# Patient Record
Sex: Female | Born: 1960
Health system: Southern US, Community
[De-identification: ages and names within clinical notes are randomized; demographics above are authoritative.]

## PROBLEM LIST (undated history)

## (undated) DIAGNOSIS — G4700 Insomnia, unspecified: Secondary | ICD-10-CM

## (undated) DIAGNOSIS — K219 Gastro-esophageal reflux disease without esophagitis: Secondary | ICD-10-CM

## (undated) DIAGNOSIS — R251 Tremor, unspecified: Secondary | ICD-10-CM

## (undated) DIAGNOSIS — T7840XA Allergy, unspecified, initial encounter: Secondary | ICD-10-CM

## (undated) DIAGNOSIS — M797 Fibromyalgia: Secondary | ICD-10-CM

## (undated) DIAGNOSIS — F32A Depression, unspecified: Secondary | ICD-10-CM

## (undated) DIAGNOSIS — M24549 Contracture, unspecified hand: Secondary | ICD-10-CM

## (undated) DIAGNOSIS — M1990 Unspecified osteoarthritis, unspecified site: Secondary | ICD-10-CM

## (undated) HISTORY — DX: Contracture, unspecified hand: M24.549

## (undated) HISTORY — PX: TONSILLECTOMY: SUR1361

## (undated) HISTORY — DX: Insomnia, unspecified: G47.00

## (undated) HISTORY — PX: TUBAL LIGATION: SHX77

## (undated) HISTORY — PX: REPAIR OF SYNDACTYLY HAND: SUR1196

## (undated) HISTORY — DX: Depression, unspecified: F32.A

## (undated) HISTORY — DX: Unspecified osteoarthritis, unspecified site: M19.90

## (undated) HISTORY — PX: DILATION AND CURETTAGE OF UTERUS: SHX78

## (undated) HISTORY — DX: Allergy, unspecified, initial encounter: T78.40XA

## (undated) HISTORY — DX: Gastro-esophageal reflux disease without esophagitis: K21.9

## (undated) HISTORY — DX: Fibromyalgia: M79.7

## (undated) HISTORY — PX: SPINE SURGERY: SHX786

## (undated) HISTORY — DX: Tremor, unspecified: R25.1

---

## 2001-10-11 ENCOUNTER — Encounter: Payer: Self-pay | Admitting: Obstetrics and Gynecology

## 2001-10-11 ENCOUNTER — Ambulatory Visit (HOSPITAL_COMMUNITY): Admission: RE | Admit: 2001-10-11 | Discharge: 2001-10-11 | Payer: Self-pay | Admitting: Obstetrics and Gynecology

## 2002-07-20 ENCOUNTER — Encounter (HOSPITAL_COMMUNITY): Admission: RE | Admit: 2002-07-20 | Discharge: 2002-08-19 | Payer: Self-pay | Admitting: Preventative Medicine

## 2006-04-10 ENCOUNTER — Ambulatory Visit (HOSPITAL_COMMUNITY): Admission: RE | Admit: 2006-04-10 | Discharge: 2006-04-10 | Payer: Self-pay | Admitting: Orthopedic Surgery

## 2007-04-29 ENCOUNTER — Ambulatory Visit (HOSPITAL_COMMUNITY): Admission: RE | Admit: 2007-04-29 | Discharge: 2007-04-29 | Payer: Self-pay | Admitting: Orthopaedic Surgery

## 2008-08-03 ENCOUNTER — Encounter: Admission: RE | Admit: 2008-08-03 | Discharge: 2008-08-03 | Payer: Self-pay | Admitting: Obstetrics and Gynecology

## 2008-09-24 ENCOUNTER — Emergency Department (HOSPITAL_COMMUNITY): Admission: EM | Admit: 2008-09-24 | Discharge: 2008-09-24 | Payer: Self-pay | Admitting: Emergency Medicine

## 2010-06-19 ENCOUNTER — Telehealth (INDEPENDENT_AMBULATORY_CARE_PROVIDER_SITE_OTHER): Payer: Self-pay | Admitting: Radiology

## 2010-06-23 ENCOUNTER — Encounter: Payer: Self-pay | Admitting: Internal Medicine

## 2010-06-23 ENCOUNTER — Ambulatory Visit (HOSPITAL_COMMUNITY)
Admission: RE | Admit: 2010-06-23 | Discharge: 2010-06-23 | Payer: Self-pay | Source: Home / Self Care | Attending: Family Medicine | Admitting: Family Medicine

## 2010-06-23 ENCOUNTER — Ambulatory Visit: Admit: 2010-06-23 | Payer: Self-pay

## 2010-06-23 ENCOUNTER — Ambulatory Visit: Admission: RE | Admit: 2010-06-23 | Discharge: 2010-06-23 | Payer: Self-pay | Source: Home / Self Care

## 2010-07-10 NOTE — Progress Notes (Signed)
Summary: stress echo pre-procedure  Phone Note Outgoing Call   Call placed by: Domenic Polite, CNMT,  June 19, 2010 12:16 PM Call placed to: Patient Reason for Call: Confirm/change Appt Summary of Call: [facility] about Stress Echocardiogram.

## 2010-07-23 ENCOUNTER — Ambulatory Visit: Payer: Self-pay | Admitting: Cardiology

## 2010-07-30 ENCOUNTER — Ambulatory Visit (HOSPITAL_COMMUNITY)
Admission: RE | Admit: 2010-07-30 | Discharge: 2010-07-30 | Disposition: A | Discharge status: Home / Self Care | Payer: 59 | Source: Ambulatory Visit | Attending: Cardiology | Admitting: Cardiology

## 2010-07-30 ENCOUNTER — Encounter: Payer: Self-pay | Admitting: Cardiology

## 2010-07-30 ENCOUNTER — Other Ambulatory Visit: Payer: Self-pay | Admitting: Cardiology

## 2010-07-30 ENCOUNTER — Ambulatory Visit (INDEPENDENT_AMBULATORY_CARE_PROVIDER_SITE_OTHER): Payer: Commercial Managed Care - PPO | Admitting: Cardiology

## 2010-07-30 DIAGNOSIS — R079 Chest pain, unspecified: Secondary | ICD-10-CM

## 2010-07-30 DIAGNOSIS — R0789 Other chest pain: Secondary | ICD-10-CM | POA: Insufficient documentation

## 2010-07-30 DIAGNOSIS — R9430 Abnormal result of cardiovascular function study, unspecified: Secondary | ICD-10-CM

## 2010-07-31 ENCOUNTER — Encounter: Payer: Self-pay | Admitting: Cardiology

## 2010-08-05 ENCOUNTER — Inpatient Hospital Stay (HOSPITAL_BASED_OUTPATIENT_CLINIC_OR_DEPARTMENT_OTHER)
Admission: RE | Admit: 2010-08-05 | Discharge: 2010-08-05 | Disposition: A | Discharge status: Home / Self Care | Payer: 59 | Source: Ambulatory Visit | Attending: Internal Medicine | Admitting: Internal Medicine

## 2010-08-05 ENCOUNTER — Ambulatory Visit: Payer: Self-pay | Admitting: Cardiology

## 2010-08-05 DIAGNOSIS — R9439 Abnormal result of other cardiovascular function study: Secondary | ICD-10-CM | POA: Insufficient documentation

## 2010-08-05 DIAGNOSIS — R079 Chest pain, unspecified: Secondary | ICD-10-CM

## 2010-08-05 DIAGNOSIS — Q245 Malformation of coronary vessels: Secondary | ICD-10-CM | POA: Insufficient documentation

## 2010-08-05 NOTE — Assessment & Plan Note (Signed)
Summary: ***np 2d echo septal hypokinesis w exercise suggestive of isc...   Visit Type:  Initial Consult Primary Provider:  Dr. Fara Chute   History of Present Illness: 50 year old woman referred for cardiology consultation. She reports a long-standing history of intermittent chest pain, at least over the last 6 years, not clearly exertional. She indicates that this has been most likely consistent with her fibromyalgia. Approximately 3 months ago she experienced a more intense episode of back pain/mid scapular discomfort, that began when she was at work on third shift. Her typical chest pains are a "deep" left breast discomfort. This pain was more intense and sharp. She eventually mentioned this to Dr. Neita Carp at an office visit and he arranged for her to have a followup exercise echocardiogram, performed back in mid-January in our [place] office. The study was read by Dr. Jens Som, and the full report is outlined below.  Labs from December 2011 showed glucose 86, BUN 12, creatinine 0.7, sodium 139, potassium 3.8, AST 13, ALT 11, WBC 6.9, hemoglobin 13.6, platelets 28, cholesterol 182, triglycerides 89, HDL 79, LDL 86, TSH 3.0. Faxed copy of ECG from December 2011 showed sinus rhythm with possible left atrial enlargement, decreased R-wave progression without frank anterior Q waves.  Today we discussed the results of her stress test. She indicated that she was actually not aware of the results at all. It is interesting to note that the ECG portion of the study was normal, however there was description of a hypotensive response to exercise, and septal hypokinesis with stress. One wonders if the blood pressure response was artifactual, and perhaps the septal abnormality may have been related to off axis imaging, as this would be an unusual distribution for a significant blockage in a major epicardial vessel. Having said that, the study is technically abnormal, and she continues to have chest pain  symptoms.  Current Medications (verified): 1)  Ultram 50 Mg Tabs (Tramadol Hcl) .... Take As Needed 2)  Ambien 10 Mg Tabs (Zolpidem Tartrate) .... Take At Bedtime As Needed  Allergies (verified): 1)  ! Codeine  Comments:  Nurse/Medical Assistant: patient and i reviewed meds she is only on 2 meds [facility] in [place]  Past History:  Family History: Last updated: 07/30/2010 Father: hypertension, heart block in his 34s, status post pacemaker Mother: hypertension, status post pacemaker, hyperlipidemia Siblings: brother with hypertension, sister with "heart valve problem"  Social History: Last updated: 07/30/2010 Tobacco Use - No.  Alcohol Use - yes (social) Regular Exercise - no Drug Use - no Full Time - third shift nurse at [employer]  Past Medical History: Insomnia Fibromyalgia Congenital syndactyly status post surgical repair  Past Surgical History: Tonsillectomy Tubal ligation D&C  Family History: Father: hypertension, heart block in his 23s, status post pacemaker Mother: hypertension, status post pacemaker, hyperlipidemia Siblings: brother with hypertension, sister with "heart valve problem"  Social History: Tobacco Use - No.  Alcohol Use - yes (social) Regular Exercise - no Drug Use - no Full Time - third shift nurse at [employer]  Review of Systems       The patient complains of chest pain.  The patient denies anorexia, fever, weight loss, syncope, dyspnea on exertion, peripheral edema, prolonged cough, hemoptysis, abdominal pain, melena, hematochezia, and severe indigestion/heartburn.         Otherwise reviewed and negative.  Vital Signs:  Patient profile:   50 year old female Height:      70 inches Weight:      169 pounds  BMI:     24.34 Pulse rate:   67 / minute BP sitting:   115 / 68  (left arm)  Vitals Entered By: Dreama Saa, CNA (July 30, 2010 2:44 PM)  Physical Exam  Additional Exam:  Normally nourished appearing  woman in no acute distress. HEENT: Conjunctiva and lids normal, oropharynx clear. Neck: Supple, no elevated JVP or carotid bruits, no thyromegaly. Lungs: Clear to auscultation, nonlabored. Cardiac: Regular rate and rhythm, no S3 or rub. Abdomen: Soft, nontender, bowel sounds present. Skin: Warm and dry. Extremities: No pitting edema, distal pulses full. Musculoskeletal: No kyphosis. Neuropsychiatric: Alert and oriented x3, affect appropriate.   Stress Echocardiogram  Procedure date:  06/23/2010  Findings:      Stress results: Maximal heart rate during stress was 168bpm (98% of   maximal predicted heart rate). The maximal predicted heart rate was   172bpm. The target heart rate was achieved. The heart rate response   to stress was normal. There was a normal resting blood pressure with   a hypotensive response to stress. The rate-pressure product for the   peak heart rate and blood pressure was Hg/min. Stress   induced chest pain.    --------------------------------------------------------------------   Stress ECG: There were no stress arrhythmias or conduction   abnormalities. The stress ECG was normal.    --------------------------------------------------------------------   Baseline:    - LV size was normal.   - LV global systolic function was normal. The estimated LV ejection     fraction was 60%.   - Normal wall motion; no LV regional wall motion abnormalities.   Peak stress:    - LV size was reduced appropriately.   - LV global systolic function was normal. The estimated LV ejection     fraction was 65%.   - Hypokinesis of the septum.    --------------------------------------------------------------------   Stress echo results: Left ventricular ejection fraction was normal   at rest and with stress. Septal hypokinesis noted with stress.  EKG  Procedure date:  07/30/2010  Findings:      Normal sinus rhythm with sinus arrhythmia, 72 beats per minute,  possible left atrial enlargement.  Impression & Recommendations:  Problem # 1:  CARDIOVASCULAR STUDIES, ABNORMAL (ICD-794.30)  Abnormal exercise echocardiogram as outlined above. Admittedly, it is unusual for there to be no diagnostic ST segment changes with a true hypotensive response to exercise, and in the absence of chest pain. A focal septal wall motion abnormality would also be unusual for ischemia in the distribution of a major epicardial vessel. Having said that, she continues to have intermittent chest pain symptoms, and a clear diagnosis is needed. We discussed further diagnostic options, and plan will be to proceed with a diagnostic cardiac catheterization as an outpatient, via radial approach, to clearly define the coronary anatomy, exclude any potential coronary anomalies, and also assess for obstructive coronary artery disease. Risks and benefits were discussed, and she is in agreement proceed.  Problem # 2:  CHEST PAIN (ICD-786.50)  Largely atypical features. Further testing is pending as discussed above.  Orders: T-Basic Metabolic Panel [id number]) Cardiac Catheterization (Cardiac Cath) T-Chest x-ray, 2 views ([id number])  Other Orders: T-CBC w/Diff ([id number]) T-PTT ([id number]) T-Protime, Auto [id number])  Patient Instructions: 1)  Your physician recommends that you schedule a follow-up appointment in: after Cath 2)  Your physician recommends that you return for lab work in: TODAY 3)  Your physician recommends that you continue on your current medications as directed. Please refer to the Current  Medication list given to you today. 4)  A chest x-ray takes a picture of the organs and structures inside the chest, including the heart, lungs, and blood vessels. This test can show several things, including, whether the heart is enlarged; whether fluid is building up in the lungs; and whether pacemaker / defibrillator leads are still in place. 5)  Your physician has requested  that you have a cardiac catheterization.  Cardiac catheterization is used to diagnose and/or treat various heart conditions. Doctors may recommend this procedure for a number of different reasons. The most common reason is to evaluate chest pain. Chest pain can be a symptom of coronary artery disease (CAD), and cardiac catheterization can show whether plaque is narrowing or blocking your heart's arteries. This procedure is also used to evaluate the valves, as well as measure the blood flow and oxygen levels in different parts of your heart.  For further information please visit [email or web address]  Please follow instruction sheet, as given.

## 2010-08-05 NOTE — Letter (Signed)
Summary: Cardiac Catheterization Instructions- J[facility] Lab  [facility] [place]  [address], [place] [address]   Phone: [phone]  Fax: [phone]     07/30/2010 MRN: [id number]  Miranda Petersen [address] [place], [place]  [address]  [place]  Dear Ms. Tavenner,   You are scheduled for a Cardiac Catheterization on _2-28-12________ with Dr.__Cooper___________  Please arrive to the 1st floor of the Heart and Vascular Center at [facility] at _10:30____ am on the day of your procedure. Please do not arrive before 6:30 a.m. Call the Heart and Vascular Center at [phone] if you are unable to make your appointmnet. The Code to get into the parking garage under the building is__0300______. Take the elevators to the 1st floor. You must have someone to drive you home. Someone must be with you for the first 24 hours after you arrive home. Please wear clothes that are easy to get on and off and wear slip-on shoes. Do not eat or drink after midnight except water with your medications that morning. Bring all your medications and current insurance cards with you.  ___ DO NOT take these medications before your procedure: ________________________________________________________________  ___ Make sure you take your aspirin.  _X__ You may take ALL of your medications with water that morning. ________________________________________________________________________________________________________________________________  ___ DO NOT take ANY medications before your procedure.  ___ Pre-med instructions:  ________________________________________________________________________________________________________________________________  The usual length of stay after your procedure is 2 to 3 hours. This can vary.  If you have any questions, please call the office at the number listed above.   Larita Fife Via LPN

## 2010-08-05 NOTE — Letter (Signed)
Summary: [facility] RECORDS  [facility] RECORDS   Imported By: Faythe Ghee 07/31/2010 11:05:38  _____________________________________________________________________  External Attachment:    Type:   Image     Comment:   External Document

## 2010-08-11 ENCOUNTER — Encounter: Payer: Self-pay | Admitting: Cardiology

## 2010-08-18 ENCOUNTER — Telehealth (INDEPENDENT_AMBULATORY_CARE_PROVIDER_SITE_OTHER): Payer: Self-pay

## 2010-08-19 ENCOUNTER — Encounter: Payer: Self-pay | Admitting: Cardiology

## 2010-08-19 ENCOUNTER — Ambulatory Visit (HOSPITAL_COMMUNITY): Payer: 59 | Attending: Internal Medicine

## 2010-08-19 DIAGNOSIS — R079 Chest pain, unspecified: Secondary | ICD-10-CM | POA: Insufficient documentation

## 2010-08-19 DIAGNOSIS — I2510 Atherosclerotic heart disease of native coronary artery without angina pectoris: Secondary | ICD-10-CM

## 2010-08-19 DIAGNOSIS — R9430 Abnormal result of cardiovascular function study, unspecified: Secondary | ICD-10-CM

## 2010-08-25 ENCOUNTER — Other Ambulatory Visit: Payer: Self-pay | Admitting: Anesthesiology

## 2010-08-25 ENCOUNTER — Other Ambulatory Visit: Payer: Self-pay | Admitting: Infectious Diseases

## 2010-08-25 ENCOUNTER — Encounter: Payer: Self-pay | Admitting: *Deleted

## 2010-08-25 ENCOUNTER — Ambulatory Visit (HOSPITAL_COMMUNITY)
Admission: RE | Admit: 2010-08-25 | Discharge: 2010-08-25 | Disposition: A | Discharge status: Home / Self Care | Payer: 59 | Source: Ambulatory Visit | Attending: Podiatry | Admitting: Podiatry

## 2010-08-25 ENCOUNTER — Encounter (HOSPITAL_COMMUNITY): Payer: 59

## 2010-08-25 ENCOUNTER — Other Ambulatory Visit (HOSPITAL_COMMUNITY): Payer: Self-pay | Admitting: Podiatry

## 2010-08-25 DIAGNOSIS — M2030 Hallux varus (acquired), unspecified foot: Secondary | ICD-10-CM

## 2010-08-25 DIAGNOSIS — M25579 Pain in unspecified ankle and joints of unspecified foot: Secondary | ICD-10-CM | POA: Insufficient documentation

## 2010-08-25 DIAGNOSIS — M25476 Effusion, unspecified foot: Secondary | ICD-10-CM | POA: Insufficient documentation

## 2010-08-25 DIAGNOSIS — M25473 Effusion, unspecified ankle: Secondary | ICD-10-CM | POA: Insufficient documentation

## 2010-08-25 DIAGNOSIS — L84 Corns and callosities: Secondary | ICD-10-CM

## 2010-08-25 LAB — HEMOGLOBIN AND HEMATOCRIT, BLOOD: Hemoglobin: 13.3 g/dL (ref 12.0–15.0)

## 2010-08-25 LAB — BASIC METABOLIC PANEL
BUN: 13 mg/dL (ref 6–23)
Creatinine, Ser: 0.76 mg/dL (ref 0.4–1.2)
GFR calc non Af Amer: >60 mL/min (ref >60)
Glucose, Bld: 81 mg/dL (ref 70–99)
Potassium: 4.4 mEq/L (ref 3.5–5.1)

## 2010-08-26 NOTE — Progress Notes (Signed)
Summary: Nuc. Pre-Procedure  Phone Note Outgoing Call Call back at [facility] Phone [phone]   Summary of Call: Left message with information on Myoview Information Sheet (see scanned document for details) by Melvyn Novas.Jaquesha Boroff,RN.      Nuclear Med Background Indications for Stress Test: Evaluation for Ischemia  Indications Comments: Recent mild (+) Stress Echo>2/28/12Cath: Prominent LAD myocardial bridging> assess LAD for true ischemia.  History: Echo, Heart Catheterization  History Comments: 06/23/10 Stress Echo: Septal wall motion abnormality,EF=60-65%. 08/05/10 Cath:EF=60-65%, Prominent LAD myocardial bridging.  Symptoms: Chest Pain  Symptoms Comments: CP radiates to back and mid scapular pain.   Nuclear Pre-Procedure Height (in): 70

## 2010-08-26 NOTE — Assessment & Plan Note (Addendum)
Summary: Cardiology Nuclear Testing  Nuclear Med Background Indications for Stress Test: Evaluation for Ischemia  Indications Comments: Assess LAD for true ischemia.  History: Echo, Heart Catheterization  History Comments: 06/23/10 Stress Echo:Septal wall motion abnormality, EF=60-65%>08/05/10 Cath:EF=60-65%, prominent LAD myocardial bridging.  Symptoms: Chest Pain, Fatigue, Rapid HR, SOB  Symptoms Comments: CP>back. Last episode of CP:8:30 a.m. today, 2/10.   Nuclear Pre-Procedure Caffeine/Decaff Intake: None NPO After: 8:00 PM Lungs: Clear IV 0.9% NS with Angio Cath: 22g     IV Site: R Antecubital IV Started by: Irean Hong, RN Chest Size (in) 36     Cup Size D     Height (in): 70 Weight (lb): 163.5 BMI: 23.54  Nuclear Med Study 1 or 2 day study:  1 day     Stress Test Type:  Stress Reading MD:  Willa Rough, MD     Referring MD:  Arvilla Meres, MD Resting Radionuclide:  Technetium 84m Tetrofosmin     Resting Radionuclide Dose:  11 mCi  Stress Radionuclide:  Technetium 1m Tetrofosmin     Stress Radionuclide Dose:  33 mCi   Stress Protocol Exercise Time (min):  9:00 min     Max HR:  166 bpm     Predicted Max HR:  172 bpm  Max Systolic BP: 167 mm Hg     Percent Max HR:  96.51 %     METS: 10.4 Rate Pressure Product:  [address]    Stress Test Technologist:  Rea College, CMA-N     Nuclear Technologist:  Domenic Polite, CNMT  Rest Procedure  Myocardial perfusion imaging was performed at rest 45 minutes following the intravenous administration of Technetium 43m Tetrofosmin.  Stress Procedure  The patient exercised for nine minutes on the treadmill utilizing the Bruce protocol.  The patient stopped due to fatigue.  She did c/o chest tightness, 7/10, with exercise.  There were no significant ST-T wave changes.  Technetium 23m Tetrofosmin was injected at peak exercise and myocardial perfusion imaging was performed after a brief delay.  QPS Raw Data Images:  Normal; no motion  artifact; normal heart/lung ratio. Stress Images:  Normal homogeneous uptake in all areas of the myocardium. Rest Images:  Normal homogeneous uptake in all areas of the myocardium. Subtraction (SDS):  No evidence of ischemia. Transient Ischemic Dilatation:  1.04  (Normal <1.22)  Lung/Heart Ratio:  .33  (Normal <0.45)  Quantitative Gated Spect Images QGS EDV:  59 ml QGS ESV:  14 ml QGS EF:  76 % QGS cine images:  Normal  Findings Normal nuclear study      Overall Impression  Exercise Capacity: Fairly good BP Response: Normal blood pressure response. Clinical Symptoms: Chest tight  7/10 ECG Impression: No significant ST segment change suggestive of ischemia. Overall Impression: Normal stress nuclear study.  Appended Document: Cardiology Nuclear Testing looks good. Myocardial bridge does not appear hemodynamicaly significant.

## 2010-08-28 ENCOUNTER — Ambulatory Visit (HOSPITAL_COMMUNITY)
Admission: RE | Admit: 2010-08-28 | Discharge: 2010-08-28 | Disposition: A | Discharge status: Home / Self Care | Payer: 59 | Source: Ambulatory Visit | Attending: Podiatry | Admitting: Podiatry

## 2010-08-28 ENCOUNTER — Ambulatory Visit (HOSPITAL_COMMUNITY): Payer: 59

## 2010-08-28 DIAGNOSIS — L84 Corns and callosities: Secondary | ICD-10-CM | POA: Insufficient documentation

## 2010-08-28 DIAGNOSIS — M2030 Hallux varus (acquired), unspecified foot: Secondary | ICD-10-CM | POA: Insufficient documentation

## 2010-09-01 ENCOUNTER — Ambulatory Visit: Payer: Commercial Managed Care - PPO | Admitting: Cardiology

## 2010-09-01 ENCOUNTER — Ambulatory Visit: Payer: Commercial Managed Care - PPO | Admitting: Adult Health

## 2010-09-04 ENCOUNTER — Encounter: Payer: Commercial Managed Care - PPO | Admitting: Internal Medicine

## 2010-09-04 NOTE — Letter (Signed)
Summary: [place] Results Engineer, agricultural at [facility]  [address] [place] [address]   Phone: [phone]  Fax: [phone]      August 25, 2010 MRN: [id number]   [facility] [address] [place], [place]  [address]   Dear Ms. Fiske,  Your test ordered by Selena Batten has been reviewed by your physician (or physician assistant) and was found to be  stable. Your physician (or physician assistant) felt no changes were needed at this time.  ____ Echocardiogram  __x__ Cardiac Stress Test  ____ Lab Work  ____ Peripheral vascular study of arms, legs or neck  ____ CT scan or X-ray  ____ Lung or Breathing test  ____ Other:  No change in medical treatment at this time, per Dr. Diona Browner. Thank you, Melba Araki Allyne Gee RN    Fords Bing, MD, Lenise Arena.C.Gaylord Shih, MD, F.A.C.C Lewayne Bunting, MD, F.A.C.C Nona Dell, MD, F.A.C.C Charlton Haws, MD, Lenise Arena.C.C

## 2010-09-04 NOTE — Procedures (Signed)
  NAME:  Delval, Miranda Petersen                 ACCOUNT NO.:  [id number]  MEDICAL RECORD NO.:  [id number]           PATIENT TYPE:  O  LOCATION:  RAD                           FACILITY:  [facility]  PHYSICIAN:  Bevelyn Buckles. Brandon Wiechman, MDDATE OF BIRTH:  Sep 23, 1960  DATE OF PROCEDURE:  08/05/2010 DATE OF DISCHARGE:  07/30/2010                           CARDIAC CATHETERIZATION   PRIMARY CARE PHYSICIAN:  Dr. Fara Chute.  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  She had a stress echocardiogram which had a normal EKG, but had a question of hypotensive response to exercise with septal wall motion abnormality with exercise. Results of the test were reviewed with her and she was referred for cardiac catheterization.  PROCEDURES PERFORMED: 1. Selective coronary angiography. 2. Left heart cath. 3. Left ventriculogram.  DESCRIPTION OF PROCEDURE:  The risks and indication were explained. Consent was signed and placed in the chart.  A 4-French arterial sheath was placed in the right femoral artery using a modified Seldinger technique.  Standard catheters including a JL-4, 3-D RCA and angled pigtail were used.  There were no apparent complications.  Central aortic pressure 118/75 with a mean of 95.  LV pressure 135/1 with an EDP of 14.  There was no aortic stenosis.  There was question of very mild mid cavitary gradient.  CORONARY ANATOMY:  Left main was angiographically normal.  LAD was a long vessel coursing to the apex.  It gave off a large diagonal in the midsection.  Just after the diagonal and the mid LAD, there was evidence of a fairly prominent myocardial bridging segment. There was some haziness during systole, but diastolic flow was normal.  Left circumflex gave off a ramus branch and 2 OMs.  It was angiographically normal.  Right coronary artery was a large dominant vessel gave  off a PDA and a posterolateral.  It was angiographically normal.  Left ventriculogram done in the RAO position and EF of 60-65% with normal wall motion.  ASSESSMENT: 1. No evidence of coronary atherosclerosis. 2. Prominent left anterior descending (coronary artery) myocardial     bridging segment. 3. Normal left ventricular function.  PLAN/DISCUSSION:  By catheterization, her myocardial bridging segment does not appear to be flow-limiting and diastole.  However, she does have symptoms of chest pain and a mildly positive stress echo.  At this point, I think we need to further evaluate with a stress Myoview to see if she is truly ischemic in the LAD territory.  We will arrange this through our office here in [place].  She will follow up with Dr. Diona Browner.     Bevelyn Buckles. Adison Jerger, MD     DRB/MEDQ  D:  08/05/2010  T:  08/06/2010  Job:  [id number]  cc:   Suanne Marker, MD  Electronically Signed by Arvilla Meres MD on 09/04/2010 06:42:51 PM

## 2010-10-08 NOTE — Op Note (Signed)
  NAME:  Schweikert, Quetzaly                 ACCOUNT NO.:  [id number]  MEDICAL RECORD NO.:  [id number]           PATIENT TYPE:  O  LOCATION:  DAYP                          FACILITY:  [facility]  PHYSICIAN:  B. Theola Sequin, MD   DATE OF BIRTH:  [birth year]  DATE OF PROCEDURE:  08/28/2010 DATE OF DISCHARGE:                              OPERATIVE REPORT   SURGEON:  B. Theola Sequin, MD  ASSISTANT:  Neva Seat  PREOPERATIVE DIAGNOSES: 1. Hallux malleus, right foot. 2. Heloma molle, fourth interspace, right foot.  POSTOPERATIVE DIAGNOSES: 1. Hallux malleus, right foot. 2. Heloma molle, fourth interspace, right foot.  PROCEDURES: 1. Arthrodesis of the interphalangeal joint of the right hallux. 2. Syndactylization of the fourth and fifth digits of the right foot.  ANESTHESIA:  MAC with local.  HEMOSTASIS:  Pneumatic ankle tourniquet at 250 mmHg.  ESTIMATED BLOOD LOSS:  Minimal (less than 5 mL).  INJECTABLES:  0.5% Marcaine plain.  PATHOLOGY:  None.  COMPLICATIONS:  None.  PROCEDURE IN DETAIL:  The patient was brought to the operating room and placed on the operative table in supine position.  Pneumatic ankle tourniquet was placed about the patient's right ankle.  The foot was anesthetized using 0.5% Marcaine plain.  The foot was then scrubbed, prepped, and draped in the usual sterile manner.  Limb was then elevated, exsanguinated, and the pneumatic ankle tourniquet inflated to 250 mmHg.  Attention directed to the dorsal aspect of the right hallux where two converging similar optimal incisions were made overlying the interphalangeal joint.  Dissection was continued deep down to the level of the interphalangeal joint.  Transverse tenotomy and capsulotomy was performed.  The articular surface from the head of proximal phalanx and base of the distal phalanx were resected and passed from the operative field.  Following standard principles and techniques, a 2.0 mm Allofix cortical bone  pin and a 3.0 Synthes cannulated screw was inserted across the fusion site with adequate compression noted.  Position was confirmed with C-arm fluoroscopy.  The wound was irrigated with copious amounts of sterile irrigant.  Extensor tendon was reapproximated using 4-0 Vicryl. Skin was reapproximated using 5-0 Monocryl and 5-0 nylon.  Attention was then directed to the fourth interspace of the right foot where a hyperkeratotic lesion was noted along the lateral aspect of the fourth digit.  Skin from the fourth web space of the right foot was resected. Lateral prominence of the proximal phalanx of the fourth digit of the right foot was resected using a power bone bur and the bone was irrigated with copious amounts of sterile irrigant.  Skin was reapproximated using 5-0 nylon in a simple suture technique.  Sterile compressive dressing was then applied to the right foot.  The pneumatic ankle tourniquet deflated and prompt hyperemic response was noted to all digits of the right foot.          ______________________________ B. Theola Sequin, MD     BIM/MEDQ  D:  08/28/2010  T:  08/28/2010  Job:  [id number]  Electronically Signed by Rexford Maus  on 10/08/2010 03:13:00 PM

## 2011-03-27 ENCOUNTER — Other Ambulatory Visit: Payer: Self-pay | Admitting: Obstetrics and Gynecology

## 2011-03-27 DIAGNOSIS — Z1231 Encounter for screening mammogram for malignant neoplasm of breast: Secondary | ICD-10-CM

## 2011-04-13 ENCOUNTER — Ambulatory Visit: Payer: 59

## 2011-04-14 ENCOUNTER — Ambulatory Visit
Admission: RE | Admit: 2011-04-14 | Discharge: 2011-04-14 | Disposition: A | Discharge status: Home / Self Care | Payer: 59 | Source: Ambulatory Visit | Attending: Obstetrics and Gynecology | Admitting: Obstetrics and Gynecology

## 2011-04-14 DIAGNOSIS — Z1231 Encounter for screening mammogram for malignant neoplasm of breast: Secondary | ICD-10-CM

## 2011-05-15 ENCOUNTER — Ambulatory Visit (INDEPENDENT_AMBULATORY_CARE_PROVIDER_SITE_OTHER): Payer: 59 | Admitting: Cardiology

## 2011-05-15 ENCOUNTER — Encounter: Payer: Self-pay | Admitting: Cardiology

## 2011-05-15 VITALS — BP 120/66 | HR 76 | Ht 70.0 in | Wt 176.0 lb

## 2011-05-15 DIAGNOSIS — R9430 Abnormal result of cardiovascular function study, unspecified: Secondary | ICD-10-CM

## 2011-05-15 NOTE — Patient Instructions (Signed)
Your physician recommends that you schedule a follow-up appointment in: 1 year  

## 2011-05-15 NOTE — Assessment & Plan Note (Signed)
Specifically, history of myocardial bridging of LAD without definite evidence of ischemia by followup Myoview, otherwise normal coronary arteries. For now recommend observation. If symptoms progress or she has more palpitations, could consider a cardiac monitor. Office followup arranged.

## 2011-05-15 NOTE — Progress Notes (Signed)
**Note De-Identified Rosela Supak Obfuscation** Addended by: Demetrios Loll on: 05/15/2011 04:29 PM   Modules accepted: Orders

## 2011-05-15 NOTE — Progress Notes (Signed)
Clinical Summary Miranda Petersen is a 50 y.o.female presenting for followup. She was seen in February and referred for further cardiac testing based on an abnormal stress echocardiogram. Cardiac catheterization showed no significant CAD, but myocardial bridging of the LAD. She was sent for a followup Myoview that did not reveal any ischemia however.  She denies any exertional chest pain or progressive shortness of breath. Did have a recent episode of discomfort/palpitations that lasted briefly, no dizziness or syncope. Felt worse if she bent over. These symptoms have resolved.  We reviewed her previous testing. ECG is normal.   Allergies  Allergen Reactions  . Codeine     Medication list reviewed.  Past Medical History  Diagnosis Date  . Insomnia   . Fibromyalgia   . Syndactyly     Status post surgical repair  . Nonspecific abnormal results of cardiovascular function study 2/12    Subsequent cardiac catheterization demonstrating normal coronary arteries with exception of myocardial LAD bridging    Social History Miranda Petersen reports that she has never smoked. She has never used smokeless tobacco. Miranda Petersen reports that she drinks alcohol.  Review of Systems As outlined above, otherwise negative.  Physical Examination Filed Vitals:   05/15/11 1347  BP: 120/66  Pulse: 76    Normally nourished appearing woman in no acute distress.  HEENT: Conjunctiva and lids normal, oropharynx clear.  Neck: Supple, no elevated JVP or carotid bruits, no thyromegaly.  Lungs: Clear to auscultation, nonlabored.  Cardiac: Regular rate and rhythm, no S3 or rub.  Skin: Warm and dry.  Extremities: No pitting edema, distal pulses full.   ECG Normal sinus rhythm, sinus arrhythmia.   Problem List and Plan

## 2011-05-20 ENCOUNTER — Encounter: Payer: Self-pay | Admitting: Cardiology

## 2011-09-15 ENCOUNTER — Other Ambulatory Visit (HOSPITAL_COMMUNITY): Payer: Self-pay | Admitting: Family Medicine

## 2011-09-15 DIAGNOSIS — T07XXXA Unspecified multiple injuries, initial encounter: Secondary | ICD-10-CM

## 2011-09-18 ENCOUNTER — Other Ambulatory Visit (HOSPITAL_COMMUNITY): Payer: 59

## 2011-09-22 ENCOUNTER — Ambulatory Visit (HOSPITAL_COMMUNITY)
Admission: RE | Admit: 2011-09-22 | Discharge: 2011-09-22 | Disposition: A | Discharge status: Home / Self Care | Payer: 59 | Source: Ambulatory Visit | Attending: Family Medicine | Admitting: Family Medicine

## 2011-09-22 DIAGNOSIS — X58XXXA Exposure to other specified factors, initial encounter: Secondary | ICD-10-CM | POA: Insufficient documentation

## 2011-09-22 DIAGNOSIS — T07XXXA Unspecified multiple injuries, initial encounter: Secondary | ICD-10-CM

## 2011-09-22 DIAGNOSIS — Z780 Asymptomatic menopausal state: Secondary | ICD-10-CM | POA: Insufficient documentation

## 2011-09-22 DIAGNOSIS — T148XXA Other injury of unspecified body region, initial encounter: Secondary | ICD-10-CM | POA: Insufficient documentation

## 2012-02-25 ENCOUNTER — Other Ambulatory Visit: Payer: Self-pay | Admitting: Obstetrics and Gynecology

## 2012-02-25 DIAGNOSIS — Z1231 Encounter for screening mammogram for malignant neoplasm of breast: Secondary | ICD-10-CM

## 2012-02-29 ENCOUNTER — Ambulatory Visit: Payer: 59

## 2012-03-29 ENCOUNTER — Ambulatory Visit: Payer: 59

## 2014-01-02 ENCOUNTER — Telehealth: Payer: Self-pay

## 2014-01-02 NOTE — Telephone Encounter (Signed)
Patient called to set up her tcs and stated that she is having no problems. [phone]

## 2014-01-03 NOTE — Telephone Encounter (Signed)
Tried to call pt on the number [phone] and it would not go through x 2. Tried to call home number [phone] and recording said there were technical difficulties.

## 2014-01-09 NOTE — Telephone Encounter (Signed)
LMOM to call.

## 2014-01-17 NOTE — Telephone Encounter (Signed)
Letter to pt to call to schedule colonoscopy.  

## 2015-01-03 ENCOUNTER — Ambulatory Visit (INDEPENDENT_AMBULATORY_CARE_PROVIDER_SITE_OTHER): Payer: 59 | Admitting: Otolaryngology

## 2015-01-03 DIAGNOSIS — J3489 Other specified disorders of nose and nasal sinuses: Secondary | ICD-10-CM

## 2015-01-03 DIAGNOSIS — H6123 Impacted cerumen, bilateral: Secondary | ICD-10-CM | POA: Diagnosis not present

## 2015-08-06 DIAGNOSIS — M1612 Unilateral primary osteoarthritis, left hip: Secondary | ICD-10-CM | POA: Diagnosis not present

## 2015-08-06 DIAGNOSIS — F5101 Primary insomnia: Secondary | ICD-10-CM | POA: Diagnosis not present

## 2015-08-06 DIAGNOSIS — F320 Major depressive disorder, single episode, mild: Secondary | ICD-10-CM | POA: Diagnosis not present

## 2015-08-06 DIAGNOSIS — F1020 Alcohol dependence, uncomplicated: Secondary | ICD-10-CM | POA: Diagnosis not present

## 2015-08-06 DIAGNOSIS — K210 Gastro-esophageal reflux disease with esophagitis: Secondary | ICD-10-CM | POA: Diagnosis not present

## 2015-11-08 ENCOUNTER — Ambulatory Visit (HOSPITAL_COMMUNITY)
Admission: EM | Admit: 2015-11-08 | Discharge: 2015-11-08 | Disposition: A | Discharge status: Home / Self Care | Payer: 59 | Attending: Emergency Medicine | Admitting: Emergency Medicine

## 2015-11-08 ENCOUNTER — Encounter (HOSPITAL_COMMUNITY): Payer: Self-pay | Admitting: Emergency Medicine

## 2015-11-08 DIAGNOSIS — H1033 Unspecified acute conjunctivitis, bilateral: Secondary | ICD-10-CM | POA: Diagnosis not present

## 2015-11-08 MED ORDER — POLYMYXIN B-TRIMETHOPRIM 10000-0.1 UNIT/ML-% OP SOLN: 1.0000 [drp] | OPHTHALMIC | Status: DC

## 2015-11-08 NOTE — Discharge Instructions (Signed)
How to Use Eye Drops and Eye Ointments HOW TO APPLY EYE DROPS Follow these steps when applying eye drops:  Wash your hands.  Tilt your head back.  Put a finger under your eye and use it to gently pull your lower lid downward. Keep that finger in place.  Using your other hand, hold the dropper between your thumb and index finger.  Position the dropper just over the edge of the lower lid. Hold it as close to your eye as you can without touching the dropper to your eye.  Steady your hand. One way to do this is to lean your index finger against your brow.  Look up.  Slowly and gently squeeze one drop of medicine into your eye.  Close your eye.  Place a finger between your lower eyelid and your nose. Press gently for 2 minutes. This increases the amount of time that the medicine is exposed to the eye. It also reduces side effects that can develop if the drop gets into the bloodstream through the nose. HOW TO APPLY EYE OINTMENTS Follow these steps when applying eye ointments:  Wash your hands.  Put a finger under your eye and use it to gently pull your lower lid downward. Keep that finger in place.  Using your other hand, place the tip of the tube between your thumb and index finger with the remaining fingers braced against your cheek or nose.  Hold the tube just over the edge of your lower lid without touching the tube to your lid or eyeball.  Look up.  Line the inner part of your lower lid with ointment.  Gently pull up on your upper lid and look down. This will force the ointment to spread over the surface of the eye.  Release the upper lid.  If you can, close your eyes for 1-2 minutes. Do not rub your eyes. If you applied the ointment correctly, your vision will be blurry for a few minutes. This is normal. ADDITIONAL INFORMATION  Make sure to use the eye drops or ointment as told by your health care provider.  If you have been told to use both eye drops and an eye  ointment, apply the eye drops first, then wait 3-4 minutes before you apply the ointment.  Try not to touch the tip of the dropper or tube to your eye. A dropper or tube that has touched the eye can become contaminated.   This information is not intended to replace advice given to you by your health care provider. Make sure you discuss any questions you have with your health care provider.   Document Released: 08/31/2000 Document Revised: 10/09/2014 Document Reviewed: 05/21/2014 Elsevier Interactive Patient Education 2016 [employer] Inc[employer]  Bacterial Conjunctivitis At this time the appearance of your eye condition suggests that this is either viral or allergic. For your symptoms use Zaditor eyedrops as directed. One drop in each eye twice a day to help with inflammation and redness.  Bacterial conjunctivitis (commonly called pink eye) is redness, soreness, or puffiness (inflammation) of the white part of your eye. It is caused by a germ called bacteria. These germs can easily spread from person to person (contagious). Your eye often will become red or pink. Your eye may also become irritated, watery, or have a thick discharge.  HOME CARE   Apply a cool, clean washcloth over closed eyelids. Do this for 10-20 minutes, 3-4 times a day while you have pain.  Gently wipe away any fluid coming from  the eye with a warm, wet washcloth or cotton ball.  Wash your hands often with soap and water. Use paper towels to dry your hands.  Do not share towels or washcloths.  Change or wash your pillowcase every day.  Do not use eye makeup until the infection is gone.  Do not use machines or drive if your vision is blurry.  Stop using contact lenses. Do not use them again until your doctor says it is okay.  Do not touch the tip of the eye drop bottle or medicine tube with your fingers when you put medicine on the eye. GET HELP RIGHT AWAY IF:   Your eye is not better after 3 days of starting your  medicine.  You have a yellowish fluid coming out of the eye.  You have more pain in the eye.  Your eye redness is spreading.  Your vision becomes blurry.  You have a fever or lasting symptoms for more than 2-3 days.  You have a fever and your symptoms suddenly get worse.  You have pain in the face.  Your face gets red or puffy (swollen). MAKE SURE YOU:   Understand these instructions.  Will watch this condition.  Will get help right away if you are not doing well or get worse.   This information is not intended to replace advice given to you by your health care provider. Make sure you discuss any questions you have with your health care provider.   Document Released: 03/03/2008 Document Revised: 05/11/2012 Document Reviewed: 01/29/2012 Elsevier Interactive Patient Education [employer] Elsevier Inc.

## 2015-11-08 NOTE — ED Provider Notes (Signed)
CSN: [id number]     Arrival date & time 11/08/15  1308 History   First MD Initiated Contact with Patient 11/08/15 1416     Chief Complaint  Patient presents with  . Conjunctivitis   (Consider location/radiation/quality/duration/timing/severity/associated sxs/prior Treatment) HPI Comments: 55 year old female presents to the urgent care with redness of the left eye first noticed this morning. She has had some clear to greenish drainage from the left eye. She has minor blurriness but no other visual changes. She states she is recently had a cold. Denies eye pain. But there is some discomfort.  Patient is a 55 y.o. female presenting with conjunctivitis.  Conjunctivitis    Past Medical History  Diagnosis Date  . Insomnia   . Fibromyalgia   . Syndactyly     Status post surgical repair  . Nonspecific abnormal results of cardiovascular function study 2/12    Subsequent cardiac catheterization demonstrating normal coronary arteries with exception of myocardial LAD bridging   Past Surgical History  Procedure Laterality Date  . Tonsillectomy    . Tubal ligation    . Dilation and curettage of uterus    . Repair of syndactyly hand     Family History  Problem Relation Age of Onset  . Hypertension Father   . Hypertension Mother   . Hypertension Brother   . Valvular heart disease Sister    Social History  Substance Use Topics  . Smoking status: Never Smoker   . Smokeless tobacco: Never Used  . Alcohol Use: Yes     Comment: Socially   OB History    No data available     Review of Systems  Constitutional: Negative.   HENT: Negative for congestion, ear discharge, ear pain, rhinorrhea and sinus pressure.   Eyes: Positive for discharge and redness.       As per history of present illness  Respiratory: Negative.   Neurological: Negative.   All other systems reviewed and are negative.   Allergies  Codeine  Home Medications   Prior to Admission medications   Medication Sig  Start Date End Date Taking? Authorizing Provider  traMADol (ULTRAM) 50 MG tablet Take 50 mg by mouth as needed. Maximum dose= 8 tablets per day    Yes Historical Provider, MD  zolpidem (AMBIEN) 5 MG tablet Take 5 mg by mouth at bedtime as needed.     Yes Historical Provider, MD  trimethoprim-polymyxin b (POLYTRIM) ophthalmic solution Place 1 drop into both eyes every 4 (four) hours. 11/08/15   Hayden Rasmussenavid Lander Eslick, NP   Meds Ordered and Administered this Visit  Medications - No data to display  BP 147/90 mmHg  Pulse 90  Temp(Src) 98.4 F (36.9 C) (Oral)  Resp 18  SpO2 99% No data found.   Physical Exam  Constitutional: She is oriented to person, place, and time. She appears well-developed and well-nourished. No distress.  Eyes: EOM are normal. Pupils are equal, round, and reactive to light.  Bilateral lower contact arrival erythema. The left eye with minor swelling and conjunctival erythema to the lower lid. Minor injection to the sclera. Anterior chamber is clear. The drainage of the left eye is light and clear in the exam.  Neck: Normal range of motion. Neck supple.  Cardiovascular: Normal rate.   Pulmonary/Chest: Effort normal. No respiratory distress.  Musculoskeletal: She exhibits no edema.  Neurological: She is alert and oriented to person, place, and time. She exhibits normal muscle tone.  Skin: Skin is warm and dry.  Psychiatric: She has  a normal mood and affect.  Nursing note and vitals reviewed.   ED Course  Procedures (including critical care time)  Labs Review Labs Reviewed - No data to display  Imaging Review No results found.   Visual Acuity Review  Right Eye Distance:   Left Eye Distance:   Bilateral Distance:    Right Eye Near:   Left Eye Near:    Bilateral Near:         MDM   1. Conjunctivitis, acute, bilateral    At this time the appearance of your eye condition suggests that this is either viral or allergic. For your symptoms use Zaditor eyedrops as  directed. One drop in each eye twice a day to help with inflammation and redness.  Meds ordered this encounter  Medications  . trimethoprim-polymyxin b (POLYTRIM) ophthalmic solution    Sig: Place 1 drop into both eyes every 4 (four) hours.    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    Hayden Rasmussen, NP 11/08/15 1430

## 2015-11-08 NOTE — ED Notes (Signed)
Pt her for poss left pink eye onset this am associated w/redness and drainage.... Denies inj/trauma... A&O x4... No acute distress.

## 2016-02-25 DIAGNOSIS — M5432 Sciatica, left side: Secondary | ICD-10-CM | POA: Diagnosis not present

## 2016-02-25 DIAGNOSIS — K210 Gastro-esophageal reflux disease with esophagitis: Secondary | ICD-10-CM | POA: Diagnosis not present

## 2016-02-25 DIAGNOSIS — Z6827 Body mass index (BMI) 27.0-27.9, adult: Secondary | ICD-10-CM | POA: Diagnosis not present

## 2016-02-25 DIAGNOSIS — M1612 Unilateral primary osteoarthritis, left hip: Secondary | ICD-10-CM | POA: Diagnosis not present

## 2016-02-25 DIAGNOSIS — M545 Low back pain: Secondary | ICD-10-CM | POA: Diagnosis not present

## 2016-08-13 DIAGNOSIS — M1612 Unilateral primary osteoarthritis, left hip: Secondary | ICD-10-CM | POA: Diagnosis not present

## 2016-08-13 DIAGNOSIS — Z6825 Body mass index (BMI) 25.0-25.9, adult: Secondary | ICD-10-CM | POA: Diagnosis not present

## 2016-08-13 DIAGNOSIS — K210 Gastro-esophageal reflux disease with esophagitis: Secondary | ICD-10-CM | POA: Diagnosis not present

## 2016-08-13 DIAGNOSIS — F320 Major depressive disorder, single episode, mild: Secondary | ICD-10-CM | POA: Diagnosis not present

## 2016-08-13 DIAGNOSIS — F5101 Primary insomnia: Secondary | ICD-10-CM | POA: Diagnosis not present

## 2016-08-13 DIAGNOSIS — F1020 Alcohol dependence, uncomplicated: Secondary | ICD-10-CM | POA: Diagnosis not present

## 2016-11-10 DIAGNOSIS — H52222 Regular astigmatism, left eye: Secondary | ICD-10-CM | POA: Diagnosis not present

## 2016-11-10 DIAGNOSIS — H524 Presbyopia: Secondary | ICD-10-CM | POA: Diagnosis not present

## 2016-11-10 DIAGNOSIS — H5203 Hypermetropia, bilateral: Secondary | ICD-10-CM | POA: Diagnosis not present

## 2017-01-27 DIAGNOSIS — F5101 Primary insomnia: Secondary | ICD-10-CM | POA: Diagnosis not present

## 2017-01-27 DIAGNOSIS — F1020 Alcohol dependence, uncomplicated: Secondary | ICD-10-CM | POA: Diagnosis not present

## 2017-01-27 DIAGNOSIS — K210 Gastro-esophageal reflux disease with esophagitis: Secondary | ICD-10-CM | POA: Diagnosis not present

## 2017-01-27 DIAGNOSIS — M1612 Unilateral primary osteoarthritis, left hip: Secondary | ICD-10-CM | POA: Diagnosis not present

## 2017-01-27 DIAGNOSIS — Z6825 Body mass index (BMI) 25.0-25.9, adult: Secondary | ICD-10-CM | POA: Diagnosis not present

## 2017-01-27 DIAGNOSIS — F320 Major depressive disorder, single episode, mild: Secondary | ICD-10-CM | POA: Diagnosis not present

## 2017-01-27 DIAGNOSIS — R634 Abnormal weight loss: Secondary | ICD-10-CM | POA: Diagnosis not present

## 2017-04-09 DIAGNOSIS — Z1389 Encounter for screening for other disorder: Secondary | ICD-10-CM | POA: Diagnosis not present

## 2017-04-09 DIAGNOSIS — M797 Fibromyalgia: Secondary | ICD-10-CM | POA: Diagnosis not present

## 2017-04-09 DIAGNOSIS — Z6822 Body mass index (BMI) 22.0-22.9, adult: Secondary | ICD-10-CM | POA: Diagnosis not present

## 2017-04-09 DIAGNOSIS — M25552 Pain in left hip: Secondary | ICD-10-CM | POA: Diagnosis not present

## 2017-08-05 DIAGNOSIS — R634 Abnormal weight loss: Secondary | ICD-10-CM | POA: Diagnosis not present

## 2017-08-05 DIAGNOSIS — M1612 Unilateral primary osteoarthritis, left hip: Secondary | ICD-10-CM | POA: Diagnosis not present

## 2017-08-05 DIAGNOSIS — K210 Gastro-esophageal reflux disease with esophagitis: Secondary | ICD-10-CM | POA: Diagnosis not present

## 2017-08-05 DIAGNOSIS — F320 Major depressive disorder, single episode, mild: Secondary | ICD-10-CM | POA: Diagnosis not present

## 2017-08-05 DIAGNOSIS — F5101 Primary insomnia: Secondary | ICD-10-CM | POA: Diagnosis not present

## 2017-08-05 DIAGNOSIS — Z6825 Body mass index (BMI) 25.0-25.9, adult: Secondary | ICD-10-CM | POA: Diagnosis not present

## 2017-08-05 DIAGNOSIS — F1020 Alcohol dependence, uncomplicated: Secondary | ICD-10-CM | POA: Diagnosis not present

## 2017-11-03 DIAGNOSIS — R2231 Localized swelling, mass and lump, right upper limb: Secondary | ICD-10-CM | POA: Diagnosis not present

## 2017-11-03 DIAGNOSIS — M720 Palmar fascial fibromatosis [Dupuytren]: Secondary | ICD-10-CM | POA: Diagnosis not present

## 2017-11-03 DIAGNOSIS — M79642 Pain in left hand: Secondary | ICD-10-CM | POA: Diagnosis not present

## 2017-11-03 DIAGNOSIS — M79641 Pain in right hand: Secondary | ICD-10-CM | POA: Diagnosis not present

## 2017-11-05 ENCOUNTER — Encounter: Payer: Self-pay | Admitting: Podiatry

## 2017-11-05 ENCOUNTER — Ambulatory Visit: Payer: 59

## 2017-11-05 DIAGNOSIS — M79671 Pain in right foot: Secondary | ICD-10-CM

## 2017-11-23 DIAGNOSIS — Z0000 Encounter for general adult medical examination without abnormal findings: Secondary | ICD-10-CM | POA: Diagnosis not present

## 2017-11-26 DIAGNOSIS — E782 Mixed hyperlipidemia: Secondary | ICD-10-CM | POA: Diagnosis not present

## 2017-11-26 DIAGNOSIS — M25552 Pain in left hip: Secondary | ICD-10-CM | POA: Diagnosis not present

## 2017-11-26 DIAGNOSIS — M797 Fibromyalgia: Secondary | ICD-10-CM | POA: Diagnosis not present

## 2017-11-26 DIAGNOSIS — Z6821 Body mass index (BMI) 21.0-21.9, adult: Secondary | ICD-10-CM | POA: Diagnosis not present

## 2017-11-26 DIAGNOSIS — K210 Gastro-esophageal reflux disease with esophagitis: Secondary | ICD-10-CM | POA: Diagnosis not present

## 2017-11-26 DIAGNOSIS — F5101 Primary insomnia: Secondary | ICD-10-CM | POA: Diagnosis not present

## 2017-11-26 DIAGNOSIS — Z0001 Encounter for general adult medical examination with abnormal findings: Secondary | ICD-10-CM | POA: Diagnosis not present

## 2017-11-26 DIAGNOSIS — Z1212 Encounter for screening for malignant neoplasm of rectum: Secondary | ICD-10-CM | POA: Diagnosis not present

## 2017-12-10 DIAGNOSIS — Q828 Other specified congenital malformations of skin: Secondary | ICD-10-CM | POA: Diagnosis not present

## 2017-12-10 DIAGNOSIS — M79671 Pain in right foot: Secondary | ICD-10-CM | POA: Diagnosis not present

## 2018-02-14 NOTE — Progress Notes (Signed)
This encounter was created in error - please disregard.

## 2018-03-22 ENCOUNTER — Encounter: Payer: Self-pay | Admitting: Student

## 2018-03-22 ENCOUNTER — Encounter: Payer: Self-pay | Admitting: *Deleted

## 2018-03-22 ENCOUNTER — Ambulatory Visit: Payer: 59 | Admitting: Student

## 2018-03-22 VITALS — BP 134/82 | HR 110 | Ht 70.0 in | Wt 144.2 lb

## 2018-03-22 DIAGNOSIS — R002 Palpitations: Secondary | ICD-10-CM | POA: Diagnosis not present

## 2018-03-22 DIAGNOSIS — Q245 Malformation of coronary vessels: Secondary | ICD-10-CM

## 2018-03-22 NOTE — Patient Instructions (Signed)
Medication Instructions:  Your physician recommends that you continue on your current medications as directed. Please refer to the Current Medication list given to you today.  If you need a refill on your cardiac medications before your next appointment, please call your pharmacy.   Lab work: NONE  If you have labs (blood work) drawn today and your tests are completely normal, you will receive your results only by: Marland Kitchen MyChart Message (if you have MyChart) OR . A paper copy in the mail If you have any lab test that is abnormal or we need to change your treatment, we will call you to review the results.  Testing/Procedures: Your physician has recommended that you wear an event monitor. Event monitors are medical devices that record the heart's electrical activity. Doctors most often [place] these monitors to diagnose arrhythmias. Arrhythmias are problems with the speed or rhythm of the heartbeat. The monitor is a small, portable device. You can wear one while you do your normal daily activities. This is usually used to diagnose what is causing palpitations/syncope (passing out).    Follow-Up: At [facility], you and your health needs are our priority.  As part of our continuing mission to provide you with exceptional heart care, we have created designated Provider Care Teams.  These Care Teams include your primary Cardiologist (physician) and Advanced Practice Providers (APPs -  Physician Assistants and Nurse Practitioners) who all work together to provide you with the care you need, when you need it. You will need a follow up appointment in 3 months.  Please call our office 2 months in advance to schedule this appointment.  You may see No primary care provider on file. or one of the following Advanced Practice Providers on your designated Care Team:   [place], PA-C [facility]) . Jacolyn Reedy, PA-C [facility] Office)  Any Other Special Instructions Will Be Listed Below (If  Applicable). Thank you for choosing [facility]!

## 2018-03-22 NOTE — Progress Notes (Signed)
Cardiology Office Note    Date:  03/22/2018   ID:  SHALINA NORFOLK, DOB 02/28/1961, MRN [id number]  PCP:  Estanislado Pandy, MD  Cardiologist: Previously followed by Dr. Diona Browner in 2012  Chief Complaint  Patient presents with  . New Patient (Initial Visit)    palpitations    History of Present Illness:    Miranda Petersen is a 57 y.o. female with past medical history of myocardial bridge of LAD (as diagnosed by catheterization in 2012) and palpitations who is being evaluated as a new patient referral from Dr. Neita Carp for palpitations.   She was last examined by Dr. Diona Browner in 05/2011 and denied any recent chest pain or dyspnea on exertion at that time. A recent cardiac catheterization had been performed and showed no significant CAD but she was found to have a myocardial bridge of the LAD. Myoview study was performed and did not show any significant ischemia. Continued observation was therefore recommended. She has not been followed by Cardiology since.   In talking with the patient today, she reports overall doing well from a cardiac perspective over the past several years but has noticed worsening palpitations recently. She reports having 3-4 episodes over the past 3 weeks and describes this as her heart "skipping". She reports associated dyspnea when symptoms occur but denies any lightheadedness, dizziness, presyncope, or chest discomfort. Symptoms typically last for a few minutes and spontaneously resolve. She does follow her HR on her phone and this can vary from the 70's to 110's. She does consume a large amount of caffeine by drinking 3 to 4 cups of coffee on a daily basis along with Pepsi throughout the day. Denies any routine alcohol use.  She does not exercise regularly but is active in working as a Engineer, civil (consulting) at [employer]. Reports that last night she did experience a "shooting pain" through her sternum which only lasted for a few seconds and spontaneously resolved. She denies any  recent exertional pain or dyspnea on exertion. No recent orthopnea, PND, or lower extremity edema.   Past Medical History:  Diagnosis Date  . Fibromyalgia   . Insomnia   . Nonspecific abnormal results of cardiovascular function study 2/12   Subsequent cardiac catheterization demonstrating normal coronary arteries with exception of myocardial LAD bridging  . Syndactyly    Status post surgical repair    Past Surgical History:  Procedure Laterality Date  . DILATION AND CURETTAGE OF UTERUS    . REPAIR OF SYNDACTYLY HAND    . TONSILLECTOMY    . TUBAL LIGATION      Current Medications: Outpatient Medications Prior to Visit  Medication Sig Dispense Refill  . cyclobenzaprine (FLEXERIL) 10 MG tablet TAKE 1 TABLET BY MOUTH TWICE A DAY AS NEEDED    . traMADol (ULTRAM) 50 MG tablet Take 50 mg by mouth as needed. Maximum dose= 8 tablets per day     . zolpidem (AMBIEN) 5 MG tablet Take 5 mg by mouth at bedtime as needed.      . trimethoprim-polymyxin b (POLYTRIM) ophthalmic solution Place 1 drop into both eyes every 4 (four) hours. 10 mL 0   No facility-administered medications prior to visit.      Allergies:   Codeine   Social History   Socioeconomic History  . Marital status: Widowed    Spouse name: Not on file  . Number of children: Not on file  . Years of education: Not on file  . Highest education level: Not on  file  Occupational History  . 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 Financial resource strain: Not on file  . Food insecurity:    Worry: Not on file    Inability: Not on file  . Transportation needs:    Medical: Not on file    Non-medical: Not on file  Tobacco Use  . Smoking status: Never Smoker  . Smokeless tobacco: Never Used  Substance and Sexual Activity  . Alcohol use: Not Currently    Comment: Socially  . Drug use: No  . Sexual activity: Not on file  Lifestyle  .  Physical activity:    Days per week: Not on file    Minutes per session: Not on file  . Stress: Not on file  Relationships  . Social connections:    Talks on phone: Not on file    Gets together: Not on file    Attends religious service: Not on file    Active member of club or organization: Not on file    Attends meetings of clubs or organizations: Not on file    Relationship status: Not on file  Other Topics Concern  . Not on file  Social History Narrative   No regular exercise     Family History:  The patient's family history includes Hypertension in her brother, father, and mother; Valvular heart disease in her sister. Atrial fibrillation in her mother.   Review of Systems:   Please see the history of present illness.     General:  No chills, fever, night sweats or weight changes.  Cardiovascular:  No exertional chest pain, dyspnea on exertion, edema, orthopnea, paroxysmal nocturnal dyspnea. Positive for palpitations.  Dermatological: No rash, lesions/masses Respiratory: No cough, dyspnea Urologic: No hematuria, dysuria Abdominal:   No nausea, vomiting, diarrhea, bright red blood per rectum, melena, or hematemesis Neurologic:  No visual changes, wkns, changes in mental status. All other systems reviewed and are otherwise negative except as noted above.   Physical Exam:    VS:  BP 134/82   Pulse (!) 110   Ht 5\' 10"  (1.778 m)   Wt 144 lb 3.2 oz (65.4 kg)   SpO2 99%   BMI 20.69 kg/m    General: Well developed, well nourished Caucasian female appearing in no acute distress. Head: Normocephalic, atraumatic, sclera non-icteric, no xanthomas, nares are without discharge.  Neck: No carotid bruits. JVD not elevated.  Lungs: Respirations regular and unlabored, without wheezes or rales.  Heart: Regular rhythm, tachycardiac rate. No S3 or S4.  No murmur, no rubs, or gallops appreciated. Abdomen: Soft, non-tender, non-distended with normoactive bowel sounds. No hepatomegaly. No  rebound/guarding. No obvious abdominal masses. Msk:  Strength and tone appear normal for age. No joint deformities or effusions. Extremities: No clubbing or cyanosis. No lower extremity edema.  Distal pedal pulses are 2+ bilaterally. Neuro: Alert and oriented X 3. Moves all extremities spontaneously. No focal deficits noted. Psych:  Responds to questions appropriately with a normal affect. Skin: No rashes or lesions noted  Wt Readings from Last 3 Encounters:  03/22/18 144 lb 3.2 oz (65.4 kg)  05/15/11 176 lb (79.8 kg)  08/19/10 163 lb 8 oz (74.2 kg)     Studies/Labs Reviewed:   EKG:  EKG is ordered today. The ekg ordered today demonstrates sinus tachycardia, heart rate 107, with borderline right atrial enlargement.  No acute ST or T wave changes  when compared to prior tracings.  Recent Labs: No results found for requested labs within last 8760 hours.   Lipid Panel No results found for: CHOL, TRIG, HDL, CHOLHDL, VLDL, LDLCALC, LDLDIRECT  Additional studies/ records that were reviewed today include:   Cardiac Catheterization: 07/2010 CORONARY ANATOMY:  Left main was angiographically normal.  LAD was a long vessel coursing to the apex.  It gave off a large diagonal in the midsection.  Just after the diagonal and the mid LAD, there was evidence of a fairly prominent myocardial bridging segment. There was some haziness during systole, but diastolic flow was normal.  Left circumflex gave off a ramus branch and 2 OMs.  It was angiographically normal.  Right coronary artery was a large dominant vessel gave off a PDA and a posterolateral.  It was angiographically normal.  Left ventriculogram done in the RAO position and EF of 60-65% with normal wall motion.  ASSESSMENT: 1. No evidence of coronary atherosclerosis. 2. Prominent left anterior descending (coronary artery) myocardial     bridging segment. 3. Normal left ventricular function.   Assessment:    1. Palpitations    2. Coronary-myocardial bridge      Plan:   In order of problems listed above:  1. Palpitations - Reports having 3-4 episodes of palpitations over the past 3 weeks and reports associated dyspnea when this occurs. Symptoms typically resolve within a few minutes and she denies any associated lightheadedness, dizziness, presyncope, or chest discomfort. - the patient mentions labs were recently obtained by her PCP and we will request these records. - Will order a 30-day cardiac event monitor to assess for any significant arrhythmias. Pending monitor results and her symptoms, could consider a low-dose BB as she was on this in the past but has not utilized in several years. Reduction in caffeine intake was strongly encouraged.   Addendum: Labs reviewed (were actually obtained in 11/2017).  WBC 5.5, Hgb 12.4, platelets 231, Na+ 144, K+ 4.4, and creatinine 0.72. TSH was within normal limits at 2.820. If found to have arrhythmias on her monitor, would repeat labs given these are from 4 months ago and her symptoms started within the past 3 weeks.   2. Coronary Myocardial Bridge - documented by cardiac catheterization in 2012 as outlined above with no significant coronary atherosclerosis. Myoview at that time showed no significant ischemia.  - She does report one episode of shooting chest pain which lasted only a few seconds and seems atypical for an ischemic etiology. EKG today shows no acute ST changes when compared to prior tracings. I encouraged the patient to make [place] aware if she has recurrent or new symptoms as we could consider repeat stress testing at that time.  The patient is technically new to our practice as she was last evaluated in 2012. I did review this information with Dr. Purvis Sheffield (DOD) and he was in agreement with the assessment and plan. The patient does wish to follow-up with Dr. Diona Browner as he was her prior Cardiologist.   Medication Adjustments/Labs and Tests Ordered: Current  medicines are reviewed at length with the patient today.  Concerns regarding medicines are outlined above.  Medication changes, Labs and Tests ordered today are listed in the Patient Instructions below. Patient Instructions  Medication Instructions:  Your physician recommends that you continue on your current medications as directed. Please refer to the Current Medication list given to you today.  If you need a refill on your cardiac medications before your next appointment, please call  your pharmacy.   Lab work: NONE  If you have labs (blood work) drawn today and your tests are completely normal, you will receive your results only by: Marland Kitchen MyChart Message (if you have MyChart) OR . A paper copy in the mail If you have any lab test that is abnormal or we need to change your treatment, we will call you to review the results.  Testing/Procedures: Your physician has recommended that you wear an event monitor. Event monitors are medical devices that record the heart's electrical activity. Doctors most often [place] these monitors to diagnose arrhythmias. Arrhythmias are problems with the speed or rhythm of the heartbeat. The monitor is a small, portable device. You can wear one while you do your normal daily activities. This is usually used to diagnose what is causing palpitations/syncope (passing out).  Follow-Up: At [facility], you and your health needs are our priority.  As part of our continuing mission to provide you with exceptional heart care, we have created designated Provider Care Teams.  These Care Teams include your primary Cardiologist (physician) and Advanced Practice Providers (APPs -  Physician Assistants and Nurse Practitioners) who all work together to provide you with the care you need, when you need it. You will need a follow up appointment in 3 months.  Please call our office 2 months in advance to schedule this appointment.  You may see No primary care provider on file. or one of the  following Advanced Practice Providers on your designated Care Team:   [place], PA-C [facility]) . Jacolyn Reedy, PA-C [facility])  Any Other Special Instructions Will Be Listed Below (If Applicable). Thank you for choosing [facility]!    Signed, Ellsworth Lennox, PA-C  03/22/2018 5:20 PM    [facility] [address] [place], [place] [address] Phone: [phone]

## 2018-03-25 ENCOUNTER — Ambulatory Visit: Payer: 59 | Admitting: Student

## 2018-03-25 ENCOUNTER — Ambulatory Visit (INDEPENDENT_AMBULATORY_CARE_PROVIDER_SITE_OTHER): Payer: 59

## 2018-03-25 DIAGNOSIS — R002 Palpitations: Secondary | ICD-10-CM

## 2018-04-28 ENCOUNTER — Telehealth: Payer: Self-pay | Admitting: *Deleted

## 2018-04-28 NOTE — Telephone Encounter (Signed)
Called patient with test results. Phone has been disconnected at this time.

## 2018-04-28 NOTE — Telephone Encounter (Signed)
-----   Message from Ellsworth LennoxBrittany M Strader, [place]PA-C sent at 04/28/2018  7:21 AM EST ----- Please let the patient know that her cardiac event monitor showed no significant arrhythmias or pauses. She did have episodes of sinus tachycardia and her average heart rate was 97 bpm. If she is still having frequent palpitations, we could consider the initiation of low-dose beta-blocker therapy (Toprol-XL 25 mg daily).  If she is not interested in additional medications at this time, would continue to focus on reduction in caffeine intake. Forward results to Sasser, Clarene CritchleyPaul W, MD. Thank you.

## 2018-04-29 ENCOUNTER — Encounter: Payer: Self-pay | Admitting: *Deleted

## 2018-07-01 ENCOUNTER — Ambulatory Visit: Payer: 59 | Admitting: Cardiology

## 2018-07-01 ENCOUNTER — Encounter: Payer: Self-pay | Admitting: Cardiology

## 2018-07-01 VITALS — BP 118/82 | HR 91 | Ht 70.0 in | Wt 143.2 lb

## 2018-07-01 DIAGNOSIS — R002 Palpitations: Secondary | ICD-10-CM

## 2018-07-01 DIAGNOSIS — Q245 Malformation of coronary vessels: Secondary | ICD-10-CM | POA: Diagnosis not present

## 2018-07-01 MED ORDER — METOPROLOL SUCCINATE ER 25 MG PO TB24: 25.0000 mg | ORAL_TABLET | Freq: Every day | ORAL | 2 refills | Status: DC

## 2018-07-01 NOTE — Patient Instructions (Addendum)
Medication Instructions:   Your physician has recommended you make the following change in your medication:   Start metoprolol succinate 25 mg (Toprol XL) by mouth daily  Continue all other medications the same  Labwork:  NONE  Testing/Procedures:  NONE  Follow-Up:  Your physician recommends that you schedule a follow-up appointment in: 6 months. You will receive a reminder letter in the mail in about 4 months reminding you to call and schedule your appointment. If you don't receive this letter, please contact our office.  Any Other Special Instructions Will Be Listed Below (If Applicable).  If you need a refill on your cardiac medications before your next appointment, please call your pharmacy.

## 2018-07-01 NOTE — Progress Notes (Signed)
Cardiology Office Note  Date: 07/01/2018   ID: Miranda Petersen, DOB [birth year], MRN [id number]  PCP: Estanislado Pandy, MD  Primary Cardiologist: Nona Dell, MD   Chief Complaint  Patient presents with  . Follow-up palpitations    History of Present Illness: Miranda Petersen is a 58 y.o. female last seen in the office by Ms. Strader PA-C in October 2019 (I have not seen her since 2012).  She was evaluated at that time for palpitations.  Cardiac monitor was placed.  30-day event recorder did not demonstrate any obvious arrhythmias.  Average heart rate was 97 bpm.  Full report below.  She presents today reporting no progressive symptoms, but still feeling palpitations on a regular basis, sometimes up into her neck.  She has had no syncope with this.  I talked with her about the possibility of trying a low-dose beta-blocker to see if she felt better  Past Medical History:  Diagnosis Date  . Fibromyalgia   . Insomnia   . Nonspecific abnormal results of cardiovascular function study 2/12   Subsequent cardiac catheterization demonstrating normal coronary arteries with exception of myocardial LAD bridging  . Syndactyly    Status post surgical repair    Past Surgical History:  Procedure Laterality Date  . DILATION AND CURETTAGE OF UTERUS    . REPAIR OF SYNDACTYLY HAND    . TONSILLECTOMY    . TUBAL LIGATION      Current Outpatient Medications  Medication Sig Dispense Refill  . cyclobenzaprine (FLEXERIL) 10 MG tablet TAKE 1 TABLET BY MOUTH TWICE A DAY AS NEEDED    . traMADol (ULTRAM) 50 MG tablet Take 50 mg by mouth as needed. Maximum dose= 8 tablets per day     . zolpidem (AMBIEN) 5 MG tablet Take 5 mg by mouth at bedtime as needed.      . metoprolol succinate (TOPROL XL) 25 MG 24 hr tablet Take 1 tablet (25 mg total) by mouth daily. 90 tablet 2   No current facility-administered medications for this visit.    Allergies:  Codeine   Social History: The patient  reports that  she has never smoked. She has never used smokeless tobacco. She reports previous alcohol use. She reports that she does not use drugs.   ROS:  Please see the history of present illness. Otherwise, complete review of systems is positive for none.  All other systems are reviewed and negative.   Physical Exam: VS:  BP 118/82   Pulse 91   Ht 5\' 10"  (1.778 m)   Wt 143 lb 3.2 oz (65 kg)   SpO2 98%   BMI 20.55 kg/m , BMI Body mass index is 20.55 kg/m.  Wt Readings from Last 3 Encounters:  07/01/18 143 lb 3.2 oz (65 kg)  03/22/18 144 lb 3.2 oz (65.4 kg)  05/15/11 176 lb (79.8 kg)    General: Patient appears comfortable at rest. HEENT: Conjunctiva and lids normal, oropharynx clear. Neck: Supple, no elevated JVP or carotid bruits, no thyromegaly. Lungs: Clear to auscultation, nonlabored breathing at rest. Cardiac: Regular rate and rhythm, no S3 or significant systolic murmur, no pericardial rub. Abdomen: Soft, nontender, bowel sounds present. Extremities: No pitting edema, distal pulses 2+.  ECG: I personally reviewed the tracing from 03/22/2018 which showed sinus tachycardia with possible right atrial enlargement.  Recent Labwork:  June 2019: Cholesterol 227, triglycerides 74, HDL 73, LDL 139, BUN 15, creatinine 0.72, potassium 4.4, AST 16, ALT 11, hemoglobin 12.4, platelets 231,  TSH 2.82  Other Studies Reviewed Today:  30-day event recorder 03/28/2018: 30-day event recorder reviewed.  Sinus rhythm is noted.  Heart rate ranged from 59 bpm up to 174 bpm with average heart rate 97 bpm.  No obvious arrhythmias including peak heart rate which looks to be sinus tachycardia. There were no pauses.  Assessment and Plan:  1.  Palpitations with no significant arrhythmias documented by cardiac monitor.  Heart rate does tend to run high at times, TSH normal, not anemic.  We will try a low-dose beta-blocker to see if she feels better.  2.  History of bridging of the mid LAD by cardiac  catheterization as outlined above.  No active symptoms.  Current medicines were reviewed with the patient today.  Disposition: Follow-up in 6 months.  Signed, Jonelle Sidle, MD, [facility] 07/01/2018 1:34 PM    [facility] at [facility] [address] [place], [place], [place] [address] Phone: [phone]; Fax: [phone]

## 2018-07-05 DIAGNOSIS — F325 Major depressive disorder, single episode, in full remission: Secondary | ICD-10-CM | POA: Diagnosis not present

## 2018-07-05 DIAGNOSIS — F5101 Primary insomnia: Secondary | ICD-10-CM | POA: Diagnosis not present

## 2018-07-05 DIAGNOSIS — R002 Palpitations: Secondary | ICD-10-CM | POA: Diagnosis not present

## 2018-07-05 DIAGNOSIS — M1612 Unilateral primary osteoarthritis, left hip: Secondary | ICD-10-CM | POA: Diagnosis not present

## 2018-07-05 DIAGNOSIS — F1021 Alcohol dependence, in remission: Secondary | ICD-10-CM | POA: Diagnosis not present

## 2019-01-05 DIAGNOSIS — M1612 Unilateral primary osteoarthritis, left hip: Secondary | ICD-10-CM | POA: Diagnosis not present

## 2019-01-05 DIAGNOSIS — F5101 Primary insomnia: Secondary | ICD-10-CM | POA: Diagnosis not present

## 2019-01-05 DIAGNOSIS — Z1331 Encounter for screening for depression: Secondary | ICD-10-CM | POA: Diagnosis not present

## 2019-01-05 DIAGNOSIS — Z6821 Body mass index (BMI) 21.0-21.9, adult: Secondary | ICD-10-CM | POA: Diagnosis not present

## 2019-01-05 DIAGNOSIS — F1021 Alcohol dependence, in remission: Secondary | ICD-10-CM | POA: Diagnosis not present

## 2019-01-05 DIAGNOSIS — F339 Major depressive disorder, recurrent, unspecified: Secondary | ICD-10-CM | POA: Diagnosis not present

## 2019-01-05 DIAGNOSIS — R002 Palpitations: Secondary | ICD-10-CM | POA: Diagnosis not present

## 2019-03-09 ENCOUNTER — Telehealth: Payer: Self-pay | Admitting: Cardiology

## 2019-03-09 NOTE — Telephone Encounter (Signed)
Virtual Visit Pre-Appointment Phone Call  "(Name), I am calling you today to discuss your upcoming appointment. We are currently trying to limit exposure to the virus that causes COVID-19 by seeing patients at home rather than in the office."  1. "What is the BEST phone number to call the day of the visit?" - include this in appointment notes  2. Do you have or have access to (through a family member/friend) a smartphone with video capability that we can use for your visit?" a. If yes - list this number in appt notes as cell (if different from BEST phone #) and list the appointment type as a VIDEO visit in appointment notes b. If no - list the appointment type as a PHONE visit in appointment notes  Confirm consent - "In the setting of the current Covid19 crisis, you are scheduled for a (phone or video) visit with your provider on (date) at (time).  Just as we do with many in-office visits, in order for you to participate in this visit, we must obtain consent.  If you'd like, I can send this to your mychart (if signed up) or email for you to review.  Otherwise, I can obtain your verbal consent now.  All virtual visits are billed to your insurance company just like a normal visit would be.  By agreeing to a virtual visit, we'd like you to understand that the technology does not allow for your provider to perform an examination, and thus may limit your provider's ability to fully assess your condition. If your provider identifies any concerns that need to be evaluated in person, we will make arrangements to do so.  Finally, though the technology is pretty good, we cannot assure that it will always work on either your or our end, and in the setting of a video visit, we may have to convert it to a phone-only visit.  In either situation, we cannot ensure that we have a secure connection.  Are you willing to proceed?" STAFF: Did the patient verbally acknowledge consent to telehealth visit? Document  YES/NO here: yes 3. Advise patient to be prepared - "Two hours prior to your appointment, go ahead and check your blood pressure, pulse, oxygen saturation, and your weight (if you have the equipment to check those) and write them all down. When your visit starts, your provider will ask you for this information. If you have an Apple Watch or Kardia device, please plan to have heart rate information ready on the day of your appointment. Please have a pen and paper handy nearby the day of the visit as well."  4. Give patient instructions for MyChart download to smartphone OR Doximity/Doxy.me as below if video visit (depending on what platform provider is using)  5. Inform patient they will receive a phone call 15 minutes prior to their appointment time (may be from unknown caller ID) so they should be prepared to answer    TELEPHONE CALL NOTE  Miranda Petersen has been deemed a candidate for a follow-up tele-health visit to limit community exposure during the Covid-19 pandemic. I spoke with the patient via phone to ensure availability of phone/video source, confirm preferred email & phone number, and discuss instructions and expectations.  I reminded Miranda Petersen to be prepared with any vital sign and/or heart rhythm information that could potentially be obtained via home monitoring, at the time of her visit. I reminded Miranda Petersen to expect a phone call prior to her visit.  Miranda Petersen 03/09/2019 1:54 PM   INSTRUCTIONS FOR DOWNLOADING THE MYCHART APP TO SMARTPHONE  - The patient must first make sure to have activated MyChart and know their login information - If Apple, go to [employer] and type in MyChart in the search bar and download the app. If Android, ask patient to go to [employer] and type in [place] in the search bar and download the app. The app is free but as with any other app downloads, their phone may require them to verify saved payment information or Apple/Android  password.  - The patient will need to then log into the app with their MyChart username and password, and select [facility] as their healthcare provider to link the account. When it is time for your visit, go to the MyChart app, find appointments, and click Begin Video Visit. Be sure to Select Allow for your device to access the Microphone and Camera for your visit. You will then be connected, and your provider will be with you shortly.  **If they have any issues connecting, or need assistance please contact MyChart service desk [phone])**  **If using a computer, in order to ensure the best quality for their visit they will need to use either of the following Internet Browsers: [employer], or Google Chrome**  IF USING DOXIMITY or DOXY.ME - The patient will receive a link just prior to their visit by text.     FULL LENGTH CONSENT FOR TELE-HEALTH VISIT   I hereby voluntarily request, consent and authorize [facility] and its employed or contracted physicians, physician assistants, nurse practitioners or other licensed health care professionals (the Practitioner), to provide me with telemedicine health care services (the Services") as deemed necessary by the treating Practitioner. I acknowledge and consent to receive the Services by the Practitioner via telemedicine. I understand that the telemedicine visit will involve communicating with the Practitioner through live audiovisual communication technology and the disclosure of certain medical information by electronic transmission. I acknowledge that I have been given the opportunity to request an in-person assessment or other available alternative prior to the telemedicine visit and am voluntarily participating in the telemedicine visit.  I understand that I have the right to withhold or withdraw my consent to the use of telemedicine in the course of my care at any time, without affecting my right to future care or treatment,  and that the Practitioner or I may terminate the telemedicine visit at any time. I understand that I have the right to inspect all information obtained and/or recorded in the course of the telemedicine visit and may receive copies of available information for a reasonable fee.  I understand that some of the potential risks of receiving the Services via telemedicine include:   Delay or interruption in medical evaluation due to technological equipment failure or disruption;  Information transmitted may not be sufficient (e.g. poor resolution of images) to allow for appropriate medical decision making by the Practitioner; and/or   In rare instances, security protocols could fail, causing a breach of personal health information.  Furthermore, I acknowledge that it is my responsibility to provide information about my medical history, conditions and care that is complete and accurate to the best of my ability. I acknowledge that Practitioner's advice, recommendations, and/or decision may be based on factors not within their control, such as incomplete or inaccurate data provided by me or distortions of diagnostic images or specimens that may result from electronic transmissions. I understand that the  practice of medicine is not an Chief Strategy Officer and that Practitioner makes no warranties or guarantees regarding treatment outcomes. I acknowledge that I will receive a copy of this consent concurrently upon execution via email to the email address I last provided but may also request a printed copy by calling the office of [facility].    I understand that my insurance will be billed for this visit.   I have read or had this consent read to me.  I understand the contents of this consent, which adequately explains the benefits and risks of the Services being provided via telemedicine.   I have been provided ample opportunity to ask questions regarding this consent and the Services and have had my questions  answered to my satisfaction.  I give my informed consent for the services to be provided through the use of telemedicine in my medical care  By participating in this telemedicine visit I agree to the above.

## 2019-03-16 ENCOUNTER — Telehealth: Payer: 59 | Admitting: Cardiology

## 2019-04-05 DIAGNOSIS — F1021 Alcohol dependence, in remission: Secondary | ICD-10-CM | POA: Diagnosis not present

## 2019-04-05 DIAGNOSIS — F339 Major depressive disorder, recurrent, unspecified: Secondary | ICD-10-CM | POA: Diagnosis not present

## 2019-04-05 DIAGNOSIS — G20 Parkinson's disease: Secondary | ICD-10-CM | POA: Diagnosis not present

## 2019-04-05 DIAGNOSIS — M1612 Unilateral primary osteoarthritis, left hip: Secondary | ICD-10-CM | POA: Diagnosis not present

## 2019-04-05 DIAGNOSIS — R002 Palpitations: Secondary | ICD-10-CM | POA: Diagnosis not present

## 2019-04-05 DIAGNOSIS — F5101 Primary insomnia: Secondary | ICD-10-CM | POA: Diagnosis not present

## 2019-04-17 ENCOUNTER — Encounter: Payer: Self-pay | Admitting: Neurology

## 2019-04-17 ENCOUNTER — Other Ambulatory Visit: Payer: Self-pay

## 2019-04-17 ENCOUNTER — Ambulatory Visit: Payer: 59 | Admitting: Neurology

## 2019-04-17 VITALS — BP 142/89 | HR 116 | Ht 70.0 in | Wt 150.0 lb

## 2019-04-17 DIAGNOSIS — R251 Tremor, unspecified: Secondary | ICD-10-CM

## 2019-04-17 DIAGNOSIS — R419 Unspecified symptoms and signs involving cognitive functions and awareness: Secondary | ICD-10-CM | POA: Diagnosis not present

## 2019-04-17 MED ORDER — PROPRANOLOL HCL 10 MG PO TABS: 10.0000 mg | ORAL_TABLET | Freq: Three times a day (TID) | ORAL | 3 refills | Status: DC | PRN

## 2019-04-17 NOTE — Progress Notes (Signed)
Subjective:    Patient ID: CHARLESIA CANADAY is a 58 y.o. female.  HPI     Huston Foley, MD, PhD [facility] [address], [address] [address] [place], [place] [address]  Dear Dr. Neita Carp,   I saw your patient, Loreen Bankson, upon your kind request to my neurologic clinic today for initial consultation of her tremor.  The patient is unaccompanied today.  As you know, Ms. Cotta is a 58 year old right-handed woman with an underlying medical history of palpitations, osteoarthritis, depression, insomnia, prior history of alcohol dependence (per chart review and by self admission, none in the past 2 years), and fibromyalgia, who reports a 2-year history of intermittent head bobbing as well as a intermittent hand tremor affecting both hands.  She has recently tried metoprolol for tachycardia but stopped taking it.  She has no family history of tremors.  She has had some cognitive concerns including forgetfulness and difficulty focusing at work.  She is a Engineer, civil (consulting).  She has been taking tramadol as needed.  She does take Flexeril each night as well as Ambien 5 to 10 mg each night for sleep.  She does drink caffeine in the form of coffee, 2 cups/day and also 2 cans of soda per day or sometimes tea instead of soda.  She does not always hydrate well with water. I reviewed your office note from 04/05/2019.  She has not had any blood work this year.  She reports that her thyroid function was okay last year.  She has been on Paxil for the past 3 months or so.  She lost her mother in June.  She started taking the Paxil in July or August.  She reports a history of dementia in her father who died 2 years ago.  She reports that he had Alzheimer's disease.  Mom lived to be 38 years old.  She tries to sleep about 7 to 8 hours.  She tries to be in bed around 10 and rise time is around 6.  Her tremor is intermittent.  Sometimes she feels that it is just a head bobbing.  Sometimes it is in her hand, right side  probably worse than left.  Her Past Medical History Is Significant For: Past Medical History:  Diagnosis Date  . Fibromyalgia   . Insomnia   . Nonspecific abnormal results of cardiovascular function study 2/12   Subsequent cardiac catheterization demonstrating normal coronary arteries with exception of myocardial LAD bridging  . Syndactyly    Status post surgical repair    Her Past Surgical History Is Significant For: Past Surgical History:  Procedure Laterality Date  . DILATION AND CURETTAGE OF UTERUS    . REPAIR OF SYNDACTYLY HAND    . TONSILLECTOMY    . TUBAL LIGATION      Her Family History Is Significant For: Family History  Problem Relation Age of Onset  . Hypertension Father   . Hypertension Mother   . Atrial fibrillation Mother   . Hypertension Brother   . Valvular heart disease Sister     Her Social History Is Significant For: Social History   Socioeconomic History  . Marital status: Widowed    Spouse name: Not on file  . Number of children: Not on file  . Years of education: Not on file  . Highest education level: Not on file  Occupational History  . Occupation: Nurse    Employer: [employer]    Comment: Full time, thrid shift at [employer] hospital  Social Needs  . Financial resource strain: Not on file  . Food insecurity    Worry: Not on file    Inability: Not on file  . Transportation needs    Medical: Not on file    Non-medical: Not on file  Tobacco Use  . Smoking status: Never Smoker  . Smokeless tobacco: Never Used  Substance and Sexual Activity  . Alcohol use: Not Currently    Comment: Socially  . Drug use: No  . Sexual activity: Not on file  Lifestyle  . Physical activity    Days per week: Not on file    Minutes per session: Not on file  . Stress: Not on file  Relationships  . Social Musicianconnections    Talks on phone: Not on file    Gets together: Not on file    Attends religious service: Not on file    Active member of club or  organization: Not on file    Attends meetings of clubs or organizations: Not on file    Relationship status: Not on file  Other Topics Concern  . Not on file  Social History Narrative   No regular exercise    Her Allergies Are:  Allergies  Allergen Reactions  . Codeine   :   Her Current Medications Are:  Outpatient Encounter Medications as of 04/17/2019  Medication Sig  . cyclobenzaprine (FLEXERIL) 10 MG tablet TAKE 1 TABLET BY MOUTH TWICE A DAY AS NEEDED  . PARoxetine (PAXIL) 20 MG tablet Take 20 mg by mouth at bedtime.  . traMADol (ULTRAM) 50 MG tablet Take 50 mg by mouth as needed. Maximum dose= 8 tablets per day   . zolpidem (AMBIEN) 10 MG tablet Take 10 mg by mouth at bedtime as needed for sleep.  . [DISCONTINUED] metoprolol succinate (TOPROL XL) 25 MG 24 hr tablet Take 1 tablet (25 mg total) by mouth daily.  . [DISCONTINUED] zolpidem (AMBIEN) 5 MG tablet Take 5 mg by mouth at bedtime as needed.     No facility-administered encounter medications on file as of 04/17/2019.   :   Review of Systems:  Out of a complete 14 point review of systems, all are reviewed and negative with the exception of these symptoms as listed below: Review of Systems  Neurological:       Pt presents today to discuss her tremor. Pt is right handed. Pt notices the tremor in her hands and in her head and neck. She has noticed symptoms for the past year and it is getting worse.    Objective:  Neurological Exam  Physical Exam Physical Examination:   Vitals:   04/17/19 1506  BP: (!) 142/89  Pulse: (!) 116   General Examination: The patient is a very pleasant 58 y.o. female in no acute distress. She appears well-developed and well-nourished and well groomed.   HEENT: Normocephalic, atraumatic, pupils are equal, round and reactive to light, Extraocular tracking is well preserved, she wears corrective eyeglasses. Face is symmetric with normal facial animation.  Speech is clear without dysarthria,  hypophonia or voice tremor.  Hearing is grossly intact.  She has no lip, neck or jaw tremor.  Airway examination reveals moderate mouth dryness, tongue protrudes centrally in palate elevates symmetrically.  She has no carotid bruits. Neck is supple with FROM.   Chest: Clear to auscultation without wheezing, rhonchi or crackles noted.  Heart: S1+S2+0, regular and normal without murmurs, rubs or gallops noted.   Abdomen: Soft, non-tender and non-distended with normal  bowel sounds appreciated on auscultation.  Extremities: There is no pitting edema in the distal lower extremities bilaterally.   Skin: Warm and dry without trophic changes noted.  Musculoskeletal: exam revealsLeft hand Dupuytren's contracture, status post syndactyly repair in both hands, evidence of syndactyly in both feet.   Neurologically:  Mental status: The patient is awake, alert and oriented in all 4 spheres. Her immediate and remote memory, attention, language skills and fund of knowledge are appropriate. There is no evidence of aphasia, agnosia, apraxia or anomia. Speech is clear with normal prosody and enunciation. Thought process is linear. Mood is normal and affect is normal.  Cranial nerves II - XII are as described above under HEENT exam. In addition: shoulder shrug is normal with equal shoulder height noted. Motor exam: Normal bulk, strength and tone is noted. There is no drift, resting tremor.   On 04/17/2019: On Archimedes spiral drawing, she has nonspecific insecurity with the left hand, with the right hand which is her dominant hand, no trembling is noted, she has no postural, action tremor, no intention tremor, no resting tremor. Romberg is negative. Reflexes are 2+ throughout. Babinski: Toes are flexor bilaterally. Fine motor skills and coordination: intact with normal finger taps, normal hand movements, normal rapid alternating patting, normal foot taps and normal foot agility.  Cerebellar testing: No dysmetria or  intention tremor on finger to nose testing. Heel to shin is unremarkable bilaterally. There is no truncal or gait ataxia.  Sensory exam: intact to light touch in the upper and lower extremities.  Gait, station and balance: She stands easily. No veering to one side is noted. No leaning to one side is noted. Posture is age-appropriate and stance is narrow based. Gait shows normal stride length and normal pace. No problems turning are noted. Tandem walk is Challenging but doable.  Assessment and Plan:   In summary, PEYTEN PUNCHES is a very pleasant 58 y.o.-year old female with an underlying medical history of palpitations, osteoarthritis, depression, insomnia, prior history of alcohol dependence (per chart review and by self admission, none in the past 2 years), and fibromyalgia, who Presents for evaluation of her tremors.  She reports an intermittent head tremor as well as Intermittent hand tremors.  On examination, she has no obvious tremor today.  She is largely reassured.  She is advised regarding potential tremor triggers.  This includes certain medications including SSRI type antidepressants.  She does report that her tremor comes and goes.  She is furthermore reassured that there is no evidence of parkinsonism.  She did worry about it as I understand.She is advised to reduce her caffeine intake and limit herself to 1-2 servings per day. In addition, she is encouraged to drink more water, 6 to 8 cups of water are recommended.  She has cognitive concerns.  She is advised that she should be mindful that Ambien at 10 mg strength may cause her daytime residual symptoms such as sluggishness in her thinking.  It is only approved for 5 mg in women.  She is also advised that Flexeril can cause residual sleepiness and is susceptible person during the day.  Nevertheless, she does not take a high dose of it, but she does take it every night.  She is advised that we can proceed with a brain MRI to rule out a structural  cause of her symptoms including her tremor.  We can also proceed with blood work today, including B12, CMP, and TSH.  We will call her with her  results.She reports that her tremor sometimes is worse than others.  For as needed use, she can try low-dose propranolol 10 mg strength up to 3 times daily as needed.  She was given a new prescription and instructions.  I suggested follow-up in about 3 months with the nurse practitioner. I answered all her questions today and she was in agreement. Thank you very much for allowing me to participate in the care of this nice patient. If I can be of any further assistance to you please do not hesitate to call me at [phone].  Sincerely,   [name], MD, PhD

## 2019-04-17 NOTE — Patient Instructions (Signed)
You have No obvious tremor in the head or hands today.  This is reassuring.  For as needed use for tremor control, we can try you on low-dose Inderal (generic name: propranolol) 10 mg strength: take 1 pill Up to 3 times a day as needed for tremor. Common side effect reported are: lethargy, sedation, low blood pressure and low pulse rate. Please monitor your BP and Pulse every few days, if your pulse drops lower than 55 you may feel bad and we may have to adjust your dose. Same with your BP below 110/55.   I do not see any signs or symptoms of parkinson's like disease or what we call parkinsonism.   Please remember, that any kind of tremor may be exacerbated by anxiety, anger, nervousness, excitement, dehydration, sleep deprivation, by caffeine, and low blood sugar values or blood sugar fluctuations. Some medications can exacerbate tremors, this includes SSRI type medication such as Paxil. Please limit your caffeine intake to 1-2 servings per day.  You may also not be hydrating well enough with water, please try to drink 3-4 16 ounce bottles of water per day.   You also have cognitive complaints.  Please be mindful that certain medications can cause daytime sluggishness including Flexeril, and Ambien.  We will do some blood work today and call you with the result, I will also order a brain MRI and we will call you.  Please follow-up in 3 to 4 months with the nurse practitioner.

## 2019-04-18 ENCOUNTER — Telehealth: Payer: Self-pay

## 2019-04-18 LAB — COMPREHENSIVE METABOLIC PANEL
ALT: 16 IU/L (ref 0–32)
AST: 17 IU/L (ref 0–40)
Albumin/Globulin Ratio: 2 (ref 1.2–2.2)
Albumin: 4.5 g/dL (ref 3.8–4.9)
Alkaline Phosphatase: 74 IU/L (ref 39–117)
BUN/Creatinine Ratio: 11 (ref 9–23)
BUN: 8 mg/dL (ref 6–24)
Bilirubin Total: 0.5 mg/dL (ref 0.0–1.2)
CO2: 27 mmol/L (ref 20–29)
Calcium: 9.9 mg/dL (ref 8.7–10.2)
Chloride: 102 mmol/L (ref 96–106)
Creatinine, Ser: 0.73 mg/dL (ref 0.57–1.00)
GFR calc Af Amer: 106 mL/min/{1.73_m2} (ref >59)
GFR calc non Af Amer: 92 mL/min/{1.73_m2} (ref >59)
Globulin, Total: 2.3 g/dL (ref 1.5–4.5)
Glucose: 95 mg/dL (ref 65–99)
Potassium: 4.4 mmol/L (ref 3.5–5.2)
Sodium: 143 mmol/L (ref 134–144)
Total Protein: 6.8 g/dL (ref 6.0–8.5)

## 2019-04-18 LAB — CBC WITH DIFFERENTIAL/PLATELET
Basophils Absolute: 0 10*3/uL (ref 0.0–0.2)
Basos: 1 %
EOS (ABSOLUTE): 0.1 10*3/uL (ref 0.0–0.4)
Eos: 1 %
Hematocrit: 41.2 % (ref 34.0–46.6)
Hemoglobin: 13.6 g/dL (ref 11.1–15.9)
Immature Grans (Abs): 0 10*3/uL (ref 0.0–0.1)
Immature Granulocytes: 0 %
Lymphocytes Absolute: 1.9 10*3/uL (ref 0.7–3.1)
Lymphs: 32 %
MCH: 31.1 pg (ref 26.6–33.0)
MCHC: 33 g/dL (ref 31.5–35.7)
MCV: 94 fL (ref 79–97)
Monocytes Absolute: 0.3 10*3/uL (ref 0.1–0.9)
Monocytes: 5 %
Neutrophils Absolute: 3.7 10*3/uL (ref 1.4–7.0)
Neutrophils: 61 %
Platelets: 244 10*3/uL (ref 150–450)
RBC: 4.37 x10E6/uL (ref 3.77–5.28)
RDW: 12.3 % (ref 11.7–15.4)
WBC: 5.9 10*3/uL (ref 3.4–10.8)

## 2019-04-18 LAB — B12 AND FOLATE PANEL
Folate: 5.3 ng/mL (ref >3.0)
Vitamin B-12: 102 pg/mL — ABNORMAL LOW (ref 232–1245)

## 2019-04-18 LAB — TSH: TSH: 1.55 u[IU]/mL (ref 0.450–4.500)

## 2019-04-18 LAB — SEDIMENTATION RATE: Sed Rate: 2 mm/hr (ref 0–40)

## 2019-04-18 LAB — RPR: RPR Ser Ql: NONREACTIVE

## 2019-04-18 NOTE — Telephone Encounter (Signed)
I called pt again. No answer, left a message asking her to call me back. °

## 2019-04-18 NOTE — Telephone Encounter (Signed)
I called pt and discussed these lab results and recommendations with her. She will follow up with Dr. Quintin Alto. Pt verbalized understanding of results. Pt had no questions at this time but was encouraged to call back if questions arise.

## 2019-04-18 NOTE — Progress Notes (Signed)
Please ask patient to FU with Dr. Quintin Alto re: B12 deficiency. I recommend she start an OTC B12 supplement, such as 1000 mcg daily, but talk to PCP about B12 injections. She also reported, she had not had her yearly physical yet, as I recall. Other labs, including thyroid screening test called TSH, liver and kidn fx fine. Michel Bickers

## 2019-04-18 NOTE — Telephone Encounter (Signed)
-----   Message from [name], MD sent at 04/18/2019  7:41 AM EST ----- Please ask patient to FU with Dr. Quintin Alto re: B12 deficiency. I recommend she start an OTC B12 supplement, such as 1000 mcg daily, but talk to PCP about B12 injections. She also reported, she had not had her yearly physical yet, as I recall. Other labs, including thyroid screening test called TSH, liver and kidn fx fine. Miranda Petersen

## 2019-04-18 NOTE — Telephone Encounter (Signed)
I called pt to discuss her lab results and recommendations. No answer, left a message asking her to call me back.

## 2019-04-18 NOTE — Telephone Encounter (Signed)
Pt returned call. Please call back when available. 

## 2019-04-25 ENCOUNTER — Telehealth: Payer: Self-pay

## 2019-04-25 NOTE — Telephone Encounter (Signed)
I called to schedule the patient for her MRI but she did not answer so I left a VM asking them to call me back.

## 2019-04-25 NOTE — Telephone Encounter (Signed)
Pt called back. She is aware of her benefits and MRI scheduled for 04/26/19.

## 2019-04-26 ENCOUNTER — Other Ambulatory Visit: Payer: Self-pay

## 2019-04-26 ENCOUNTER — Ambulatory Visit: Payer: 59

## 2019-04-26 DIAGNOSIS — R251 Tremor, unspecified: Secondary | ICD-10-CM | POA: Diagnosis not present

## 2019-04-26 DIAGNOSIS — R419 Unspecified symptoms and signs involving cognitive functions and awareness: Secondary | ICD-10-CM

## 2019-04-26 MED ORDER — GADOBENATE DIMEGLUMINE 529 MG/ML IV SOLN
15.0000 mL | Freq: Once | INTRAVENOUS | Status: AC | PRN
  Administered 2019-04-26: 15 mL via INTRAVENOUS

## 2019-04-27 NOTE — Progress Notes (Signed)
Please call patient and advise her that her recent brain MRI with and without contrast showed no acute findings, age-appropriate findings, no explanation for her tremor.   She can follow-up as planned with the nurse practitioner in February.

## 2019-05-01 ENCOUNTER — Telehealth: Payer: Self-pay

## 2019-05-01 NOTE — Telephone Encounter (Signed)
-----   Message from [name], MD sent at 04/27/2019  6:55 PM EST ----- Please call patient and advise her that her recent brain MRI with and without contrast showed no acute findings, age-appropriate findings, no explanation for her tremor.   She can follow-up as planned with the nurse practitioner in February.

## 2019-05-01 NOTE — Telephone Encounter (Signed)
I called pt to discuss her MRI results. No answer, left a message asking her to call me back. 

## 2019-05-01 NOTE — Telephone Encounter (Signed)
Pt returned my call and I explained her MRI results and recommendations. Pt will keep her Feb 2021 appt with Amy, NP. Pt verbalized understanding of results. Pt had no questions at this time but was encouraged to call back if questions arise.

## 2019-06-23 ENCOUNTER — Other Ambulatory Visit: Payer: Self-pay | Admitting: Neurology

## 2019-06-23 NOTE — Telephone Encounter (Signed)
Pt has called for a refill on her propranolol (INDERAL) 10 MG tablet [facility] [id number] [facility] [employer]

## 2019-06-26 MED ORDER — PAROXETINE HCL 20 MG PO TABS: 20.0000 mg | ORAL_TABLET | Freq: Every day | ORAL | 2 refills | Status: AC

## 2019-06-26 MED ORDER — PROPRANOLOL HCL 10 MG PO TABS: 10.0000 mg | ORAL_TABLET | Freq: Three times a day (TID) | ORAL | 3 refills | Status: DC | PRN

## 2019-06-26 NOTE — Addendum Note (Signed)
Addended by: Ann Maki T on: 06/26/2019 07:42 AM   Modules accepted: Orders

## 2019-06-26 NOTE — Telephone Encounter (Signed)
Refill submitted to [facility] for the propranolol refill.

## 2019-07-18 ENCOUNTER — Other Ambulatory Visit: Payer: Self-pay

## 2019-07-18 ENCOUNTER — Encounter: Payer: Self-pay | Admitting: Family Medicine

## 2019-07-18 ENCOUNTER — Ambulatory Visit: Payer: 59 | Admitting: Family Medicine

## 2019-07-18 VITALS — BP 124/80 | HR 91 | Temp 97.7°F | Ht 70.0 in | Wt 160.6 lb

## 2019-07-18 DIAGNOSIS — R251 Tremor, unspecified: Secondary | ICD-10-CM | POA: Diagnosis not present

## 2019-07-18 DIAGNOSIS — R419 Unspecified symptoms and signs involving cognitive functions and awareness: Secondary | ICD-10-CM

## 2019-07-18 NOTE — Progress Notes (Addendum)
PATIENT: Miranda Petersen DOB: 10-21-1960  REASON FOR VISIT: follow up HISTORY FROM: patient  Chief Complaint  Patient presents with  . Follow-up    RM5. Alone. No questions nor concerns.      HISTORY OF PRESENT ILLNESS: Today 07/18/19 Miranda Petersen is a 59 y.o. female here today for follow up for tremor. MRI and lab work unremarkable with exception of low B12. She was given propranolol 10g TID as needed for tremor. She reports significant improvement in tremor and memory concerns with taking B12 supplements and propranolol 10mg  at night. She does not shake near as badly. Usually notices tremor more with activity. No family history of PD; dad had Alzheimer's. 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    HISTORY: (copied from Dr Guadelupe Sabin note on 04/17/2019)  Dear Dr. Quintin Alto,   I saw your patient, Miranda Petersen, upon your kind request to my neurologic clinic today for initial consultation of her tremor.  The patient is unaccompanied today.  As you know, Miranda Petersen is a 59 year old right-handed woman with an underlying medical history of palpitations, osteoarthritis, depression, insomnia, prior history of alcohol dependence (per chart review and by self admission, none in the past 2 years), and fibromyalgia, who reports a 2-year history of intermittent head bobbing as well as a intermittent hand tremor affecting both hands.  She has recently tried metoprolol for tachycardia but stopped taking it.  She has no family history of tremors.  She has had some cognitive concerns including forgetfulness and difficulty focusing at work.  She is a Marine scientist.  She has been taking tramadol as needed.  She does take Flexeril each night as well as Ambien 5 to 10 mg each night for sleep.  She does drink caffeine in the form of coffee, 2 cups/day and also 2 cans of soda per day or sometimes tea instead of soda.  She does not always hydrate well with water. I reviewed your office note from  04/05/2019.  She has not had any blood work this year.  She reports that her thyroid function was okay last year.  She has been on Paxil for the past 3 months or so.  She lost her mother in June.  She started taking the Paxil in July or August.  She reports a history of dementia in her father who died 2 years ago.  She reports that he had Alzheimer's disease.  Mom lived to be 75 years old.  She tries to sleep about 7 to 8 hours.  She tries to be in bed around 10 and rise time is around 6.  Her tremor is intermittent.  Sometimes she feels that it is just a head bobbing.  Sometimes it is in her hand, right side probably worse than left.   REVIEW OF SYSTEMS: Out of a complete 14 system review of symptoms, the patient complains only of the following symptoms, tremor and all other reviewed systems are negative.  ALLERGIES: Allergies  Allergen Reactions  . Codeine     HOME MEDICATIONS: Outpatient Medications Prior to Visit  Medication Sig Dispense Refill  . cyclobenzaprine (FLEXERIL) 10 MG tablet TAKE 1 TABLET BY MOUTH TWICE A DAY AS NEEDED    . famotidine (PEPCID) 10 MG tablet Take 10 mg by mouth 2 (two) times daily. OTC PRN    . PARoxetine (PAXIL) 20 MG tablet Take 1 tablet (20 mg total) by mouth at bedtime. 90 tablet 2  . propranolol (INDERAL)  10 MG tablet Take 1 tablet (10 mg total) by mouth 3 (three) times daily as needed. 90 tablet 3  . traMADol (ULTRAM) 50 MG tablet Take 50 mg by mouth as needed. Maximum dose= 8 tablets per day     . zolpidem (AMBIEN) 10 MG tablet Take 10 mg by mouth at bedtime as needed for sleep.     No facility-administered medications prior to visit.    PAST MEDICAL HISTORY: Past Medical History:  Diagnosis Date  . Fibromyalgia   . Insomnia   . Nonspecific abnormal results of cardiovascular function study 2/12   Subsequent cardiac catheterization demonstrating normal coronary arteries with exception of myocardial LAD bridging  . Syndactyly    Status post surgical  repair    PAST SURGICAL HISTORY: Past Surgical History:  Procedure Laterality Date  . DILATION AND CURETTAGE OF UTERUS    . REPAIR OF SYNDACTYLY HAND    . TONSILLECTOMY    . TUBAL LIGATION      FAMILY HISTORY: Family History  Problem Relation Age of Onset  . Hypertension Father   . Hypertension Mother   . Atrial fibrillation Mother   . Hypertension Brother   . Valvular heart disease Sister     SOCIAL HISTORY: Social History   Socioeconomic History  . Marital status: Widowed    Spouse name: Not on file  . Number of children: Not on file  . Years of education: Not on file  . Highest education level: Not on file  Occupational History  . Occupation: Nurse    Employer: [employer]    Comment: Full time, thrid shift at [employer] hospital  Tobacco Use  . Smoking status: Never Smoker  . Smokeless tobacco: Never Used  Substance and Sexual Activity  . Alcohol use: Not Currently    Comment: Socially  . Drug use: No  . Sexual activity: Not on file  Other Topics Concern  . Not on file  Social History Narrative   No regular exercise   Social Determinants of Health   Financial Resource Strain:   . Difficulty of Paying Living Expenses: Not on file  Food Insecurity:   . Worried About Programme researcher, broadcasting/film/video in the Last Year: Not on file  . Ran Out of Food in the Last Year: Not on file  Transportation Needs:   . Lack of Transportation (Medical): Not on file  . Lack of Transportation (Non-Medical): Not on file  Physical Activity:   . Days of Exercise per Week: Not on file  . Minutes of Exercise per Session: Not on file  Stress:   . Feeling of Stress : Not on file  Social Connections:   . Frequency of Communication with Friends and Family: Not on file  . Frequency of Social Gatherings with Friends and Family: Not on file  . Attends Religious Services: Not on file  . Active Member of Clubs or Organizations: Not on file  . Attends Banker Meetings: Not on  file  . Marital Status: Not on file  Intimate Partner Violence:   . Fear of Current or Ex-Partner: Not on file  . Emotionally Abused: Not on file  . Physically Abused: Not on file  . Sexually Abused: Not on file      PHYSICAL EXAM  Vitals:   07/18/19 1456  BP: 124/80  Pulse: 91  Temp: 97.7 F (36.5 C)  Weight: 160 lb 9.6 oz (72.8 kg)  Height: 5\' 10"  (1.778 m)   Body mass index  is 23.04 kg/m.  Generalized: Well developed, in no acute distress  Cardiology: normal rate and rhythm, no murmur noted Respiratory: clear to auscultation bilaterally  Neurological examination  Mentation: Alert oriented to time, place, history taking. Follows all commands speech and language fluent Cranial nerve II-XII: Pupils were equal round reactive to light. Extraocular movements were full, visual field were full  Motor: The motor testing reveals 5 over 5 strength of all 4 extremities. Good symmetric motor tone is noted throughout.  Gait and station: Gait is normal.   DIAGNOSTIC DATA (LABS, IMAGING, TESTING) - I reviewed patient records, labs, notes, testing and imaging myself where available.  No flowsheet data found.   Lab Results  Component Value Date   WBC 5.9 04/17/2019   HGB 13.6 04/17/2019   HCT 41.2 04/17/2019   MCV 94 04/17/2019   PLT 244 04/17/2019      Component Value Date/Time   NA 143 04/17/2019 1616   K 4.4 04/17/2019 1616   CL 102 04/17/2019 1616   CO2 27 04/17/2019 1616   GLUCOSE 95 04/17/2019 1616   GLUCOSE 81 08/25/2010 1420   BUN 8 04/17/2019 1616   CREATININE 0.73 04/17/2019 1616   CALCIUM 9.9 04/17/2019 1616   PROT 6.8 04/17/2019 1616   ALBUMIN 4.5 04/17/2019 1616   AST 17 04/17/2019 1616   ALT 16 04/17/2019 1616   ALKPHOS 74 04/17/2019 1616   BILITOT 0.5 04/17/2019 1616   GFRNONAA 92 04/17/2019 1616   GFRAA 106 04/17/2019 1616   No results found for: CHOL, HDL, LDLCALC, LDLDIRECT, TRIG, CHOLHDL No results found for: QBHA1P Lab Results  Component  Value Date   VITAMINB12 102 (L) 04/17/2019   Lab Results  Component Value Date   TSH 1.550 04/17/2019       ASSESSMENT AND PLAN 59 y.o. year old female  has a past medical history of Fibromyalgia, Insomnia, Nonspecific abnormal results of cardiovascular function study (2/12), and Syndactyly. here with     ICD-10-CM   1. Intermittent tremor  R25.1   2. Cognitive complaints  R41.9     Miranda Petersen reports that she has noted significant improvement in both her tremor and short-term memory loss after starting oral B12 supplements and propranolol 10 mg at bedtime.  She has tolerated these medications well without obvious adverse effects.  She continues to work full-time without difficulty.  She is able to drive and manage finances.  MRI was normal.  We have discussed most likely diagnosis of essential tremor.  Anxiety may be playing a role in memory loss as well.  She will continue propranolol 10 mg daily.  May increase to 3 times daily as needed.  I have also advised that she follow-up with primary care regarding B12 deficiency may consider B12 injections monthly if levels are not improving with oral supplements.  She was encouraged to continue healthy lifestyle habits.  Regular exercise advised.  Memory compensation strategies reviewed.  She will follow-up in 1 year, sooner if needed.  She verbalizes understanding and agreement with this plan.   No orders of the defined types were placed in this encounter.    No orders of the defined types were placed in this encounter.     I spent 15 minutes with the patient. 50% of this time was spent counseling and educating patient on plan of care and medications.    Shawnie Dapper, FNP-C 07/18/2019, 3:38 PM [facility] [address] [place], [place] [address] [phone]  I  reviewed the above note and documentation by the Nurse Practitioner and agree with the history, exam, assessment and plan as outlined above. I was  available for consultation. Huston Foley, MD, PhD [facility])

## 2019-07-18 NOTE — Patient Instructions (Signed)
Continue propranolol 10mg  daily (may increase to three times if needed)   Continue healthy lifestyle, regular exercise    Follow up in 1 year, sooner if needed    Memory Compensation Strategies  1. Use "WARM" strategy.  W= write it down  A= associate it  R= repeat it  M= make a mental note  2.   You can keep a .  Use a 3-ring notebook with sections for the following: calendar, important names and phone numbers,  medications, doctors' names/phone numbers, lists/reminders, and a section to journal what you did  each day.   3.    Use a calendar to write appointments down.  4.    Write yourself a schedule for the day.  This can be placed on the calendar or in a separate section of the Memory Notebook.  Keeping a  regular schedule can help memory.  5.    Use medication organizer with sections for each day or morning/evening pills.  You may need help loading it  6.    Keep a basket, or pegboard by the door.  Place items that you need to take out with you in the basket or on the pegboard.  You may also want to  include a message board for reminders.  7.    Use sticky notes.  Place sticky notes with reminders in a place where the task is performed.  For example: " turn off the  stove" placed by the stove, "lock the door" placed on the door at eye level, " take your medications" on  the bathroom mirror or by the place where you normally take your medications.  8.    Use alarms/timers.  Use while cooking to remind yourself to check on food or as a reminder to take your medicine, or as a  reminder to make a call, or as a reminder to perform another task, etc.   Essential Tremor A tremor is trembling or shaking that a person cannot control. Most tremors affect the hands or arms. Tremors can also affect the head, vocal cords, legs, and other parts of the body. Essential tremor is a tremor without a known cause. Usually, it occurs while a person is trying to perform an  action. It tends to get worse gradually as a person ages. What are the causes? The cause of this condition is not known. What increases the risk? You are more likely to develop this condition if:  You have a family member with essential tremor.  You are age 87 or older.  You take certain medicines. What are the signs or symptoms? The main sign of a tremor is a rhythmic shaking of certain parts of your body that is uncontrolled and unintentional. You may:  Have difficulty eating with a spoon or fork.  Have difficulty writing.  Nod your head up and down or side to side.  Have a quivering voice. The shaking may:  Get worse over time.  Come and go.  Be more noticeable on one side of your body.  Get worse due to stress, fatigue, caffeine, and extreme heat or cold. How is this diagnosed? This condition may be diagnosed based on:  Your symptoms and medical history.  A physical exam. There is no single test to diagnose an essential tremor. However, your health care provider may order tests to rule out other causes of your condition. These may include:  Blood and urine tests.  Imaging studies of your brain, such as  CT scan and MRI.  A test that measures involuntary muscle movement (electromyogram). How is this treated? Treatment for essential tremor depends on the severity of the condition.  Some tremors may go away without treatment.  Mild tremors may not need treatment if they do not affect your day-to-day life.  Severe tremors may need to be treated using one or more of the following options: ? Medicines. ? Lifestyle changes. ? Occupational or physical therapy. Follow these instructions at home: Lifestyle   Do not use any products that contain nicotine or tobacco, such as cigarettes and e-cigarettes. If you need help quitting, ask your health care provider.  Limit your caffeine intake as told by your health care provider.  Try to get 8 hours of sleep each  night.  Find ways to manage your stress that fits your lifestyle and personality. Consider trying meditation or yoga.  Try to anticipate stressful situations and allow extra time to manage them.  If you are struggling emotionally with the effects of your tremor, consider working with a mental health provider. General instructions  Take over-the-counter and prescription medicines only as told by your health care provider.  Avoid extreme heat and extreme cold.  Keep all follow-up visits as told by your health care provider. This is important. Visits may include physical therapy visits. Contact a health care provider if:  You experience any changes in the location or intensity of your tremors.  You start having a tremor after starting a new medicine.  You have tremor with other symptoms, such as: ? Numbness. ? Tingling. ? Pain. ? Weakness.  Your tremor gets worse.  Your tremor interferes with your daily life.  You feel down, blue, or sad for at least 2 weeks in a row.  Worrying about your tremor and what other people think about you interferes with your everyday life functions, including relationships, work, or school. Summary  Essential tremor is a tremor without a known cause. Usually, it occurs when you are trying to perform an action.  The cause of this condition is not known.  The main sign of a tremor is a rhythmic shaking of certain parts of your body that is uncontrolled and unintentional.  Treatment for essential tremor depends on the severity of the condition. This information is not intended to replace advice given to you by your health care provider. Make sure you discuss any questions you have with your health care provider. Document Revised: 06/04/2017 Document Reviewed: 06/04/2017 Elsevier Patient Education  2020 [employer].

## 2019-08-04 DIAGNOSIS — F339 Major depressive disorder, recurrent, unspecified: Secondary | ICD-10-CM | POA: Diagnosis not present

## 2019-08-04 DIAGNOSIS — F5101 Primary insomnia: Secondary | ICD-10-CM | POA: Diagnosis not present

## 2019-08-04 DIAGNOSIS — Z6823 Body mass index (BMI) 23.0-23.9, adult: Secondary | ICD-10-CM | POA: Diagnosis not present

## 2019-08-04 DIAGNOSIS — G20 Parkinson's disease: Secondary | ICD-10-CM | POA: Diagnosis not present

## 2019-08-04 DIAGNOSIS — R002 Palpitations: Secondary | ICD-10-CM | POA: Diagnosis not present

## 2019-08-04 DIAGNOSIS — M1612 Unilateral primary osteoarthritis, left hip: Secondary | ICD-10-CM | POA: Diagnosis not present

## 2019-08-04 DIAGNOSIS — E538 Deficiency of other specified B group vitamins: Secondary | ICD-10-CM | POA: Diagnosis not present

## 2019-08-04 DIAGNOSIS — F1021 Alcohol dependence, in remission: Secondary | ICD-10-CM | POA: Diagnosis not present

## 2019-11-20 DIAGNOSIS — E538 Deficiency of other specified B group vitamins: Secondary | ICD-10-CM | POA: Diagnosis not present

## 2019-11-23 DIAGNOSIS — F5101 Primary insomnia: Secondary | ICD-10-CM | POA: Diagnosis not present

## 2019-11-23 DIAGNOSIS — M5430 Sciatica, unspecified side: Secondary | ICD-10-CM | POA: Diagnosis not present

## 2019-11-23 DIAGNOSIS — Z6823 Body mass index (BMI) 23.0-23.9, adult: Secondary | ICD-10-CM | POA: Diagnosis not present

## 2019-11-23 DIAGNOSIS — F339 Major depressive disorder, recurrent, unspecified: Secondary | ICD-10-CM | POA: Diagnosis not present

## 2019-11-23 DIAGNOSIS — E538 Deficiency of other specified B group vitamins: Secondary | ICD-10-CM | POA: Diagnosis not present

## 2019-11-23 DIAGNOSIS — R002 Palpitations: Secondary | ICD-10-CM | POA: Diagnosis not present

## 2019-11-23 DIAGNOSIS — G20 Parkinson's disease: Secondary | ICD-10-CM | POA: Diagnosis not present

## 2019-11-23 DIAGNOSIS — F1021 Alcohol dependence, in remission: Secondary | ICD-10-CM | POA: Diagnosis not present

## 2019-11-29 DIAGNOSIS — M1611 Unilateral primary osteoarthritis, right hip: Secondary | ICD-10-CM | POA: Diagnosis not present

## 2019-11-29 DIAGNOSIS — Z6822 Body mass index (BMI) 22.0-22.9, adult: Secondary | ICD-10-CM | POA: Diagnosis not present

## 2019-12-01 ENCOUNTER — Other Ambulatory Visit: Payer: Self-pay | Admitting: General Practice

## 2019-12-01 ENCOUNTER — Other Ambulatory Visit (HOSPITAL_COMMUNITY): Payer: Self-pay | Admitting: General Practice

## 2019-12-01 DIAGNOSIS — M545 Low back pain, unspecified: Secondary | ICD-10-CM

## 2019-12-07 HISTORY — PX: LUMBAR LAMINECTOMY/DECOMPRESSION MICRODISCECTOMY: SHX5026

## 2019-12-08 ENCOUNTER — Encounter: Payer: Self-pay | Admitting: Orthopaedic Surgery

## 2019-12-08 ENCOUNTER — Ambulatory Visit: Payer: 59 | Admitting: Orthopaedic Surgery

## 2019-12-08 ENCOUNTER — Ambulatory Visit (INDEPENDENT_AMBULATORY_CARE_PROVIDER_SITE_OTHER): Payer: 59

## 2019-12-08 VITALS — Ht 70.0 in | Wt 160.6 lb

## 2019-12-08 DIAGNOSIS — M25551 Pain in right hip: Secondary | ICD-10-CM

## 2019-12-08 DIAGNOSIS — M25559 Pain in unspecified hip: Secondary | ICD-10-CM

## 2019-12-08 NOTE — Addendum Note (Signed)
Addended by: Albertina Parr on: 12/08/2019 11:46 AM   Modules accepted: Orders

## 2019-12-08 NOTE — Progress Notes (Signed)
Office Visit Note   Patient: Miranda Petersen           Date of Birth: Aug 10, 1960           MRN: [id number] Visit Date: 12/08/2019              Requested by: Sasser, Clarene Critchley, MD [address] [place],  [place] [address] PCP: Estanislado Pandy, MD   Assessment & Plan: Visit Diagnoses:  1. Hip pain     Plan: Impression is continued right hip and thigh pain.  We will need to get an urgent MRI to rule out fracture versus an acute abductor tear.  Follow-up after the MRI.  She should use a cane at all times and limit her activity until we get answers from the MRI.  Follow-Up Instructions: Return if symptoms worsen or fail to improve.   Orders:  Orders Placed This Encounter  Procedures  . XR HIP UNILAT W OR W/O PELVIS 2-3 VIEWS RIGHT   No orders of the defined types were placed in this encounter.     Procedures: No procedures performed   Clinical Data: No additional findings.   Subjective: Chief Complaint  Patient presents with  . Right Hip - Pain, New Patient (Initial Visit)    Patient fell 1 month ago    Miranda Petersen comes in today for evaluation of right hip and thigh pain.  She fell a month ago directly onto her right knee and since then she has had lower back and right thigh pain to the lower extremity with throbbing.  She took prednisone that did not help.  The pain is immediately after she weightbears.  She has some pain at rest.  Flexeril has not helped.  Denies any groin pain.  Denies any numbness and tingling.   Review of Systems  Constitutional: Negative.   HENT: Negative.   Eyes: Negative.   Respiratory: Negative.   Cardiovascular: Negative.   Endocrine: Negative.   Musculoskeletal: Negative.   Neurological: Negative.   Hematological: Negative.   Psychiatric/Behavioral: Negative.   All other systems reviewed and are negative.    Objective: Vital Signs: Ht 5\' 10"  (1.778 m)   Wt 160 lb 9.6 oz (72.8 kg)   BMI 23.04 kg/m   Physical Exam Vitals and nursing note  reviewed.  Constitutional:      Appearance: She is well-developed.  HENT:     Head: Normocephalic and atraumatic.  Pulmonary:     Effort: Pulmonary effort is normal.  Abdominal:     Palpations: Abdomen is soft.  Musculoskeletal:     Cervical back: Neck supple.  Skin:    General: Skin is warm.     Capillary Refill: Capillary refill takes less than 2 seconds.  Neurological:     Mental Status: She is alert and oriented to person, place, and time.  Psychiatric:        Behavior: Behavior normal.        Thought Content: Thought content normal.        Judgment: Judgment normal.     Ortho Exam Right hip shows decent range of motion without pain.  She is very tender in the proximal thigh and lateral hip region.  No sciatic tension signs.  Negative Faber.  No significant pain with internal rotation.  Lumbar spine is nontender. Specialty Comments:  No specialty comments available.  Imaging: XR HIP UNILAT W OR W/O PELVIS 2-3 VIEWS RIGHT  Result Date: 12/08/2019 No acute or structural abnormalities  PMFS History: Patient Active Problem List   Diagnosis Date Noted  . CHEST PAIN 07/30/2010  . CARDIOVASCULAR STUDIES, ABNORMAL 07/30/2010   Past Medical History:  Diagnosis Date  . Fibromyalgia   . Insomnia   . Nonspecific abnormal results of cardiovascular function study 2/12   Subsequent cardiac catheterization demonstrating normal coronary arteries with exception of myocardial LAD bridging  . Syndactyly    Status post surgical repair    Family History  Problem Relation Age of Onset  . Hypertension Father   . Hypertension Mother   . Atrial fibrillation Mother   . Hypertension Brother   . Valvular heart disease Sister     Past Surgical History:  Procedure Laterality Date  . DILATION AND CURETTAGE OF UTERUS    . REPAIR OF SYNDACTYLY HAND    . TONSILLECTOMY    . TUBAL LIGATION     Social History   Occupational History  . 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 Smoking status: Never Smoker  . Smokeless tobacco: Never Used  Vaping Use  . Vaping Use: Never used  Substance and Sexual Activity  . Alcohol use: Not Currently    Comment: Socially  . Drug use: No  . Sexual activity: Not on file

## 2019-12-12 ENCOUNTER — Telehealth: Payer: Self-pay | Admitting: Orthopedic Surgery

## 2019-12-12 NOTE — Telephone Encounter (Signed)
Miranda Petersen is asking for a return call from [name] to discuss her MRI scheduling.  She states that the facility has nothing available until Saturday and she does not think she can wait that long.  What else can be done, should she try a different facility.  Please call at [phone].

## 2019-12-12 NOTE — Telephone Encounter (Signed)
Error, duplicate message

## 2019-12-13 NOTE — Telephone Encounter (Signed)
Pt returned call and I informed her this it probably the quickest appt she can get de to others or around 2 weeks out.

## 2019-12-13 NOTE — Telephone Encounter (Signed)
Called pt back left vm to return my call

## 2019-12-16 ENCOUNTER — Other Ambulatory Visit: Payer: Self-pay

## 2019-12-16 ENCOUNTER — Ambulatory Visit (HOSPITAL_COMMUNITY)
Admission: RE | Admit: 2019-12-16 | Discharge: 2019-12-16 | Disposition: A | Discharge status: Home / Self Care | Payer: 59 | Source: Ambulatory Visit | Attending: General Practice | Admitting: General Practice

## 2019-12-16 ENCOUNTER — Ambulatory Visit (HOSPITAL_COMMUNITY)
Admission: RE | Admit: 2019-12-16 | Discharge: 2019-12-16 | Disposition: A | Discharge status: Home / Self Care | Payer: 59 | Source: Ambulatory Visit | Attending: Orthopaedic Surgery | Admitting: Orthopaedic Surgery

## 2019-12-16 DIAGNOSIS — M545 Low back pain, unspecified: Secondary | ICD-10-CM

## 2019-12-16 DIAGNOSIS — M25559 Pain in unspecified hip: Secondary | ICD-10-CM | POA: Diagnosis not present

## 2019-12-16 DIAGNOSIS — M79605 Pain in left leg: Secondary | ICD-10-CM | POA: Diagnosis not present

## 2019-12-19 ENCOUNTER — Telehealth: Payer: Self-pay | Admitting: Orthopaedic Surgery

## 2019-12-19 ENCOUNTER — Ambulatory Visit: Payer: 59 | Admitting: Orthopaedic Surgery

## 2019-12-19 ENCOUNTER — Encounter: Payer: Self-pay | Admitting: Orthopaedic Surgery

## 2019-12-19 VITALS — Ht 70.0 in | Wt 160.0 lb

## 2019-12-19 DIAGNOSIS — M545 Low back pain, unspecified: Secondary | ICD-10-CM

## 2019-12-19 NOTE — Progress Notes (Signed)
° °  Office Visit Note   Patient: Miranda Petersen           Date of Birth: [birth year]           MRN: [id number] Visit Date: 12/19/2019              Requested by: Petersen, Miranda Critchley, MD [address] [place],  [place] [address] PCP: Miranda Pandy, MD   Assessment & Plan: Visit Diagnoses:  1. Low back pain, unspecified back pain laterality, unspecified chronicity, unspecified whether sciatica present     Plan: MRI shows a extruded disc at L4-L5 with a mass-effect on the right side of the thecal sac.  Given the findings of significant weakness she will need a referral to neurosurgery for surgical consultation.  This was placed today.  I told her that should she develop any signs or symptoms of cauda equina she would need to go to the emergency immediately.  We will see her back as needed.  Follow-Up Instructions: Return if symptoms worsen or fail to improve.   Orders:  Orders Placed This Encounter  Procedures   Ambulatory referral to Neurosurgery   No orders of the defined types were placed in this encounter.     Procedures: No procedures performed   Clinical Data: No additional findings.   Subjective: Chief Complaint  Patient presents with   Lower Back - Follow-up    MRI review   Right Leg - Follow-up    MRI review    [name] returns today for MRI review of the lumbar spine and femur.  She continues to have weakness and pain in her right leg that is causing balance issues.   Review of Systems   Objective: Vital Signs: Ht 5\' 10"  (1.778 m)    Wt 160 lb (72.6 kg)    BMI 22.96 kg/m   Physical Exam  Ortho Exam Exam is unchanged. Specialty Comments:  No specialty comments available.  Imaging: No results found.   PMFS History: Patient Active Problem List   Diagnosis Date Noted   CHEST PAIN 07/30/2010   CARDIOVASCULAR STUDIES, ABNORMAL 07/30/2010   Past Medical History:  Diagnosis Date   Fibromyalgia    Insomnia    Nonspecific abnormal results of  cardiovascular function study 2/12   Subsequent cardiac catheterization demonstrating normal coronary arteries with exception of myocardial LAD bridging   Syndactyly    Status post surgical repair    Family History  Problem Relation Age of Onset   Hypertension Father    Hypertension Mother    Atrial fibrillation Mother    Hypertension Brother    Valvular heart disease Sister     Past Surgical History:  Procedure Laterality Date   DILATION AND CURETTAGE OF UTERUS     REPAIR OF SYNDACTYLY HAND     TONSILLECTOMY     TUBAL LIGATION     Social History   Occupational History   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

## 2019-12-19 NOTE — Telephone Encounter (Signed)
[employer] forms received. Sent to [employer]. 

## 2019-12-20 ENCOUNTER — Encounter: Payer: Self-pay | Admitting: Orthopaedic Surgery

## 2019-12-22 ENCOUNTER — Encounter: Payer: Self-pay | Admitting: Orthopaedic Surgery

## 2019-12-27 DIAGNOSIS — M5126 Other intervertebral disc displacement, lumbar region: Secondary | ICD-10-CM | POA: Diagnosis not present

## 2019-12-29 DIAGNOSIS — Z1152 Encounter for screening for COVID-19: Secondary | ICD-10-CM | POA: Diagnosis not present

## 2020-01-02 DIAGNOSIS — M5126 Other intervertebral disc displacement, lumbar region: Secondary | ICD-10-CM | POA: Diagnosis not present

## 2020-01-24 ENCOUNTER — Institutional Professional Consult (permissible substitution): Payer: 59 | Admitting: Neurology

## 2020-02-02 ENCOUNTER — Other Ambulatory Visit: Payer: Self-pay | Admitting: Neurology

## 2020-06-03 DIAGNOSIS — Z20828 Contact with and (suspected) exposure to other viral communicable diseases: Secondary | ICD-10-CM | POA: Diagnosis not present

## 2020-06-03 DIAGNOSIS — J40 Bronchitis, not specified as acute or chronic: Secondary | ICD-10-CM | POA: Diagnosis not present

## 2020-06-03 DIAGNOSIS — J069 Acute upper respiratory infection, unspecified: Secondary | ICD-10-CM | POA: Diagnosis not present

## 2020-06-22 ENCOUNTER — Other Ambulatory Visit: Payer: Self-pay | Admitting: Neurology

## 2020-07-18 ENCOUNTER — Encounter: Payer: Self-pay | Admitting: Family Medicine

## 2020-07-18 ENCOUNTER — Ambulatory Visit: Payer: 59 | Admitting: Family Medicine

## 2020-07-18 VITALS — BP 110/66 | HR 78 | Ht 70.0 in | Wt 155.0 lb

## 2020-07-18 DIAGNOSIS — R419 Unspecified symptoms and signs involving cognitive functions and awareness: Secondary | ICD-10-CM

## 2020-07-18 DIAGNOSIS — R251 Tremor, unspecified: Secondary | ICD-10-CM

## 2020-07-18 NOTE — Progress Notes (Addendum)
PATIENT: Miranda Petersen DOB: [birth year]  REASON FOR VISIT: follow up HISTORY FROM: patient  Chief Complaint  Patient presents with  . Follow-up    Rm 1 alone Pt is well, tremors have improved      HISTORY OF PRESENT ILLNESS: 07/18/20 ALL:  She returns for follow up on tremor and subjective memory loss. She continues paroxetine 20mg  every other day and propranolol 10mg  BID-TID. She reports tremor is well managed. She is talking to her PCP about potentially weaning paroxetine. She is feeling well. B12 was normal with last PCP follow up. She feels she may be a little more foggy from time to time. She continues to discuss need for B12 supplements with PCP. She is tolerating propranolol well. Tremor significantly improved.    07/18/2019 ALL:  Miranda Petersen is a 60 y.o. female here today for follow up for tremor. MRI and lab work unremarkable with exception of low B12. She was given propranolol 10g TID as needed for tremor. She reports significant improvement in tremor and memory concerns with taking B12 supplements and propranolol 10mg  at night. She does not shake near as badly. Usually notices tremor more with activity. No family history of PD; dad had Alzheimer's. 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.    HISTORY: (copied from Dr note on 04/17/2019)  Dear Dr. [facility],   I saw your patient, Miranda Petersen, upon your kind request to my neurologic clinic today for initial consultation of her tremor.  The patient is unaccompanied today.  As you know, Ms. Kogler is a 60 year old right-handed woman with an underlying medical history of palpitations, osteoarthritis, depression, insomnia, prior history of alcohol dependence (per chart review and by self admission, none in the past 2 years), and fibromyalgia, who reports a 2-year history of intermittent head bobbing as well as a intermittent hand tremor affecting both hands.  She has recently tried metoprolol for  tachycardia but stopped taking it.  She has no family history of tremors.  She has had some cognitive concerns including forgetfulness and difficulty focusing at work.  She is a [employer].  She has been taking tramadol as needed.  She does take Flexeril each night as well as Ambien 5 to 10 mg each night for sleep.  She does drink caffeine in the form of coffee, 2 cups/day and also 2 cans of soda per day or sometimes tea instead of soda.  She does not always hydrate well with water. I reviewed your office note from 04/05/2019.  She has not had any blood work this year.  She reports that her thyroid function was okay last year.  She has been on Paxil for the past 3 months or so.  She lost her mother in June.  She started taking the Paxil in July or August.  She reports a history of dementia in her father who died 2 years ago.  She reports that he had Alzheimer's disease.  Mom lived to be 46 years old.  She tries to sleep about 7 to 8 hours.  She tries to be in bed around 10 and rise time is around 6.  Her tremor is intermittent.  Sometimes she feels that it is just a head bobbing.  Sometimes it is in her hand, right side probably worse than left.   REVIEW OF SYSTEMS: Out of a complete 14 system review of symptoms, the patient complains only of the following symptoms, tremor and all other reviewed  systems are negative.   ALLERGIES: Allergies  Allergen Reactions  . Codeine     HOME MEDICATIONS: Outpatient Medications Prior to Visit  Medication Sig Dispense Refill  . cyclobenzaprine (FLEXERIL) 10 MG tablet TAKE 1 TABLET BY MOUTH TWICE A DAY AS NEEDED    . famotidine (PEPCID) 10 MG tablet Take 10 mg by mouth 2 (two) times daily. OTC PRN    . PARoxetine (PAXIL) 20 MG tablet Take 1 tablet (20 mg total) by mouth at bedtime. 90 tablet 2  . propranolol (INDERAL) 10 MG tablet TAKE 1 TABLET BY MOUTH THREE TIMES A DAY AS NEEDED 90 tablet 3  . traMADol (ULTRAM) 50 MG tablet Take 50 mg by mouth every 8 (eight) hours  as needed. Maximum dose= 8 tablets per day    . zolpidem (AMBIEN) 10 MG tablet Take 10 mg by mouth at bedtime as needed for sleep.     No facility-administered medications prior to visit.    PAST MEDICAL HISTORY: Past Medical History:  Diagnosis Date  . Fibromyalgia   . Insomnia   . Nonspecific abnormal results of cardiovascular function study 2/12   Subsequent cardiac catheterization demonstrating normal coronary arteries with exception of myocardial LAD bridging  . Syndactyly    Status post surgical repair    PAST SURGICAL HISTORY: Past Surgical History:  Procedure Laterality Date  . DILATION AND CURETTAGE OF UTERUS    . LUMBAR LAMINECTOMY/DECOMPRESSION MICRODISCECTOMY  12/2019  . REPAIR OF SYNDACTYLY HAND    . TONSILLECTOMY    . TUBAL LIGATION      FAMILY HISTORY: Family History  Problem Relation Age of Onset  . Hypertension Father   . Hypertension Mother   . Atrial fibrillation Mother   . Hypertension Brother   . Valvular heart disease Sister     SOCIAL HISTORY: Social History   Socioeconomic History  . Marital status: Widowed    Spouse name: Not on file  . Number of children: Not on file  . Years of education: Not on file  . Highest education level: Not on file  Occupational History  . Occupation: Nurse    Employer: [employer]    Comment: Full time, thrid shift at [employer] [facility]  Tobacco Use  . Smoking status: Never Smoker  . Smokeless tobacco: Never Used  Vaping Use  . Vaping Use: Never used  Substance and Sexual Activity  . Alcohol use: Not Currently    Comment: Socially  . Drug use: No  . Sexual activity: Not on file  Other Topics Concern  . Not on file  Social History Narrative   No regular exercise   Social Determinants of Health   Financial Resource Strain: Not on file  Food Insecurity: Not on file  Transportation Needs: Not on file  Physical Activity: Not on file  Stress: Not on file  Social Connections: Not on file   Intimate Partner Violence: Not on file      PHYSICAL EXAM  Vitals:   07/18/20 1451  BP: 110/66  Pulse: 78  Weight: 155 lb (70.3 kg)  Height: 5\' 10"  (1.778 m)   Body mass index is 22.24 kg/m.  Generalized: Well developed, in no acute distress  Cardiology: normal rate and rhythm, no murmur noted Respiratory: clear to auscultation bilaterally  Neurological examination  Mentation: Alert oriented to time, place, history taking. Follows all commands speech and language fluent Cranial nerve II-XII: Pupils were equal round reactive to light. Extraocular movements were full, visual field were full  Motor: The motor testing reveals 5 over 5 strength of all 4 extremities. Good symmetric motor tone is noted throughout.  Gait and station: Gait is normal.   DIAGNOSTIC DATA (LABS, IMAGING, TESTING) - I reviewed patient records, labs, notes, testing and imaging myself where available.  No flowsheet data found.   Lab Results  Component Value Date   WBC 5.9 04/17/2019   HGB 13.6 04/17/2019   HCT 41.2 04/17/2019   MCV 94 04/17/2019   PLT 244 04/17/2019      Component Value Date/Time   NA 143 04/17/2019 1616   K 4.4 04/17/2019 1616   CL 102 04/17/2019 1616   CO2 27 04/17/2019 1616   GLUCOSE 95 04/17/2019 1616   GLUCOSE 81 08/25/2010 1420   BUN 8 04/17/2019 1616   CREATININE 0.73 04/17/2019 1616   CALCIUM 9.9 04/17/2019 1616   PROT 6.8 04/17/2019 1616   ALBUMIN 4.5 04/17/2019 1616   AST 17 04/17/2019 1616   ALT 16 04/17/2019 1616   ALKPHOS 74 04/17/2019 1616   BILITOT 0.5 04/17/2019 1616   GFRNONAA 92 04/17/2019 1616   GFRAA 106 04/17/2019 1616   No results found for: CHOL, HDL, LDLCALC, LDLDIRECT, TRIG, CHOLHDL No results found for: MEQA8T Lab Results  Component Value Date   VITAMINB12 102 (L) 04/17/2019   Lab Results  Component Value Date   TSH 1.550 04/17/2019       ASSESSMENT AND PLAN 60 y.o. year old female  has a past medical history of Fibromyalgia,  Insomnia, Nonspecific abnormal results of cardiovascular function study (2/12), and Syndactyly. here with     ICD-10-CM   1. Intermittent tremor  R25.1   2. Cognitive complaints  R41.9      Mrs. Soulliere reports that she has noted significant improvement in both her tremor and short-term memory loss after starting oral B12 supplements and propranolol 10 mg. B12 was normal at last check up and B12 supplements were stopped. She feels that she is doing ok. May consider restarting B12 for brain fog but discussing with PCP. Paroxetine now every other day. She is working with PCP to wean. Mood is stable. She will continue propranolol 10mg  BID-TID. May continue refills with PCP, if willing. Healthy lifestyle habits encouraged. She will follow up with as needed. She verbalizes understanding and agreement with this plan.    No orders of the defined types were placed in this encounter.    No orders of the defined types were placed in this encounter.     I spent 15 minutes with the patient. 50% of this time was spent counseling and educating patient on plan of care and medications.    [place], FNP-C 07/18/2020, 3:28 PM [facility] [address], [address] [place], [place] [phone]   I reviewed the above note and documentation by the Nurse Practitioner and agree with the history, exam, assessment and plan as outlined above. I was available for consultation. [phone], MD, PhD [facility])

## 2020-07-18 NOTE — Patient Instructions (Addendum)
Below is our plan:  We will continue paroxetine and propranolol as directed. May continue refills with PCP.   Please make sure you are staying well hydrated. I recommend 50-60 ounces daily. Well balanced diet and regular exercise encouraged. Consistent sleep schedule with 6-8 hours recommended.   Please continue follow up with care team as directed.   Follow up with me as needed.   You may receive a survey regarding today's visit. I encourage you to leave honest feed back as I do use this information to improve patient care. Thank you for seeing me today!      Tremor A tremor is trembling or shaking that you cannot control. Most tremors affect the hands or arms. Tremors can also affect the head, vocal cords, face, and other parts of the body. There are many types of tremors. Common types include:  Essential tremor. These usually occur in people older than 40. It may run in families and can happen in otherwise healthy people.  Resting tremor. These occur when the muscles are at rest, such as when your hands are resting in your lap. People with Parkinson's disease often have resting tremors.  Postural tremor. These occur when you try to hold a pose, such as keeping your hands outstretched.  Kinetic tremor. These occur during purposeful movement, such as trying to touch a finger to your nose.  Task-specific tremor. These may occur when you perform certain tasks such as writing, speaking, or standing.  Psychogenic tremor. These dramatically lessen or disappear when you are distracted. They can happen in people of all ages. Some types of tremors have no known cause. Tremors can also be a symptom of nervous system problems (neurological disorders) that may occur with aging. Some tremors go away with treatment, while others do not. Follow these instructions at home: Lifestyle  Limit alcohol intake to no more than 1 drink a day for nonpregnant women and 2 drinks a day for men. One drink  equals 12 oz of beer, 5 oz of wine, or 1 oz of hard liquor.  Do not use any products that contain nicotine or tobacco, such as cigarettes and e-cigarettes. If you need help quitting, ask your health care provider.  Avoid extreme heat and extreme cold.  Limit your caffeine intake, as told by your health care provider.  Try to get 8 hours of sleep each night.  Find ways to manage your stress, such as meditation or yoga.      General instructions  Take over-the-counter and prescription medicines only as told by your health care provider.  Keep all follow-up visits as told by your health care provider. This is important. Contact a health care provider if you:  Develop a tremor after starting a new medicine.  Have a tremor along with other symptoms such as: ? Numbness. ? Tingling. ? Pain. ? Weakness.  Notice that your tremor gets worse.  Notice that your tremor interferes with your day-to-day life. Summary  A tremor is trembling or shaking that you cannot control.  Most tremors affect the hands or arms.  Some types of tremors have no known cause. Others may be a symptom of nervous system problems (neurological disorders).  Make sure you discuss any tremors you have with your health care provider. This information is not intended to replace advice given to you by your health care provider. Make sure you discuss any questions you have with your health care provider. Document Revised: 02/16/2020 Document Reviewed: 02/16/2020 Elsevier Patient Education  [place].

## 2020-07-23 ENCOUNTER — Other Ambulatory Visit: Payer: Self-pay | Admitting: Neurology

## 2020-08-07 ENCOUNTER — Encounter: Payer: Self-pay | Admitting: Family Medicine

## 2020-10-18 DIAGNOSIS — Z6823 Body mass index (BMI) 23.0-23.9, adult: Secondary | ICD-10-CM | POA: Diagnosis not present

## 2020-10-18 DIAGNOSIS — G4700 Insomnia, unspecified: Secondary | ICD-10-CM | POA: Diagnosis not present

## 2020-10-18 DIAGNOSIS — M797 Fibromyalgia: Secondary | ICD-10-CM | POA: Diagnosis not present

## 2020-11-18 DIAGNOSIS — Z6823 Body mass index (BMI) 23.0-23.9, adult: Secondary | ICD-10-CM | POA: Diagnosis not present

## 2020-11-18 DIAGNOSIS — M1612 Unilateral primary osteoarthritis, left hip: Secondary | ICD-10-CM | POA: Diagnosis not present

## 2020-11-18 DIAGNOSIS — F339 Major depressive disorder, recurrent, unspecified: Secondary | ICD-10-CM | POA: Diagnosis not present

## 2020-11-18 DIAGNOSIS — E538 Deficiency of other specified B group vitamins: Secondary | ICD-10-CM | POA: Diagnosis not present

## 2020-11-18 DIAGNOSIS — F5101 Primary insomnia: Secondary | ICD-10-CM | POA: Diagnosis not present

## 2020-11-18 DIAGNOSIS — M5430 Sciatica, unspecified side: Secondary | ICD-10-CM | POA: Diagnosis not present

## 2020-11-18 DIAGNOSIS — R002 Palpitations: Secondary | ICD-10-CM | POA: Diagnosis not present

## 2020-11-18 DIAGNOSIS — R251 Tremor, unspecified: Secondary | ICD-10-CM | POA: Diagnosis not present

## 2021-01-01 IMAGING — MR MR FEMUR*R* W/O CM
17 series · 40 of 40 positions shown · non-contrast
Comparison: Radiographs 12/08/2019

CLINICAL DATA: Right hip and leg pain.

EXAM:
MRI OF THE RIGHT FEMUR WITHOUT CONTRAST
TECHNIQUE: Multiplanar, multisequence MR imaging of the right femur was
performed. No intravenous contrast was administered.

[Series 3: T1 · coronal · right · 5.0mm · 1.25mm/px · 2 of 36 slices shown (1 of 4)]
[im 1/36]
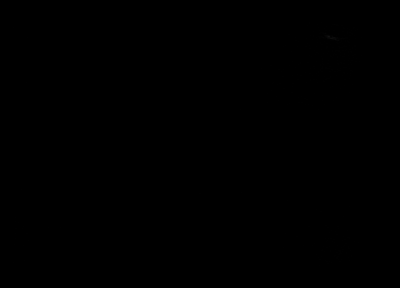
[im 36/36]
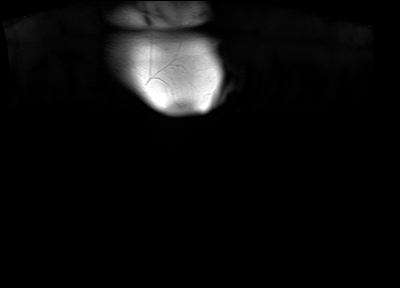

[Series 4: T1 · coronal · right · 5.0mm · 1.25mm/px · 2 of 36 slices shown (2 of 4)]
[im 1/36]
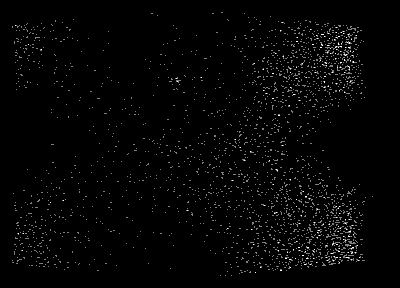
[im 36/36]
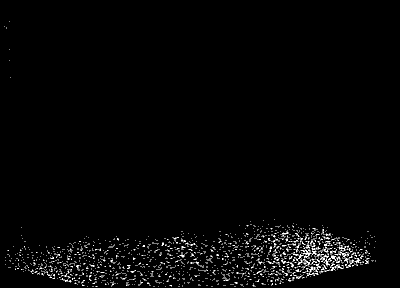

[Series 5: composed cor t1_comp · coronal · right · 6.0mm · 1.25mm/px · 2 of 36 slices shown]
[im 1/36]
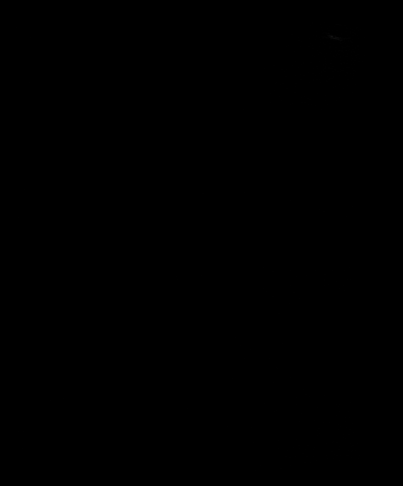
[im 36/36]
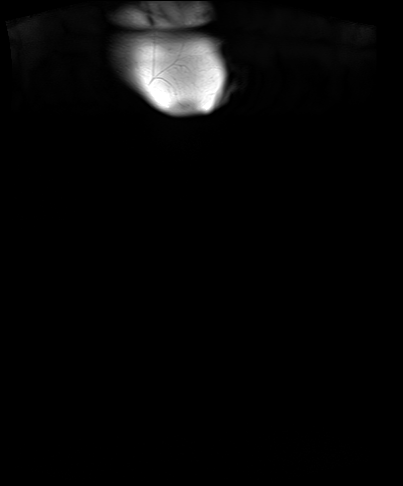

[Series 6: composed cor t1_comp_filt · coronal · right · 6.0mm · 1.25mm/px · 2 of 36 slices shown]
[im 1/36]
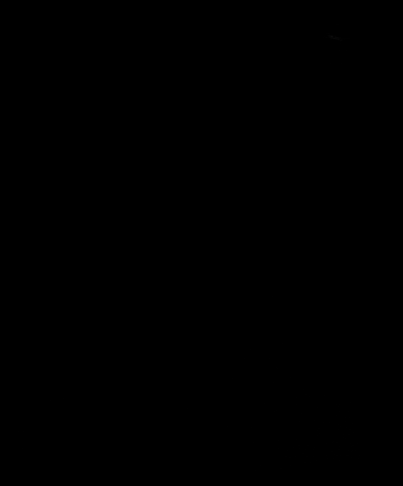
[im 36/36]
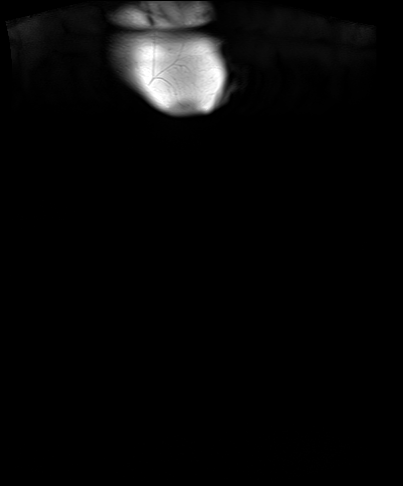

[Series 7: STIR · coronal · right · 5.0mm · 1.25mm/px · 2 of 36 slices shown (1 of 2)]
[im 1/36]
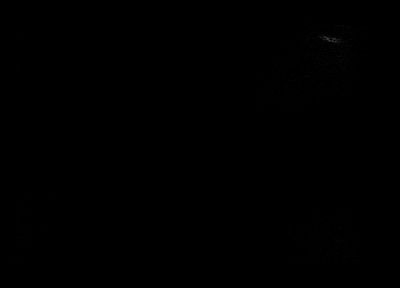
[im 36/36]
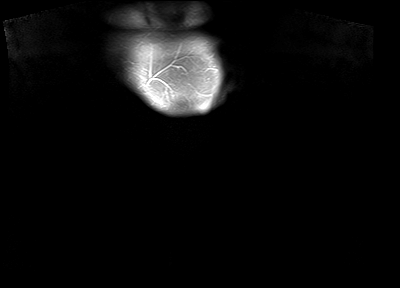

[Series 8: STIR · coronal · right · 5.0mm · 1.25mm/px · 2 of 36 slices shown (2 of 2)]
[im 1/36]
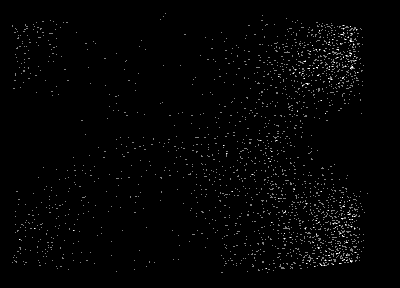
[im 36/36]
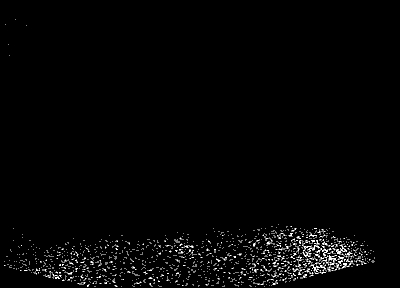

[Series 9: composed cor stir_comp · coronal · right · 6.0mm · 1.25mm/px · 2 of 36 slices shown]
[im 1/36]
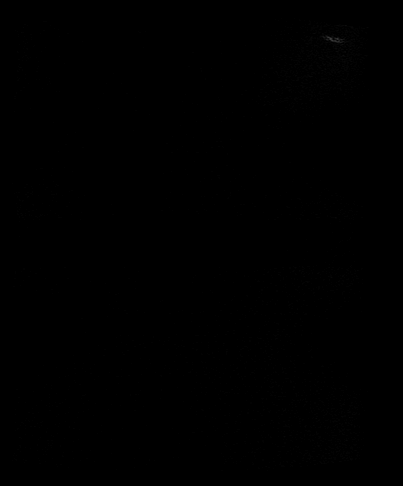
[im 36/36]
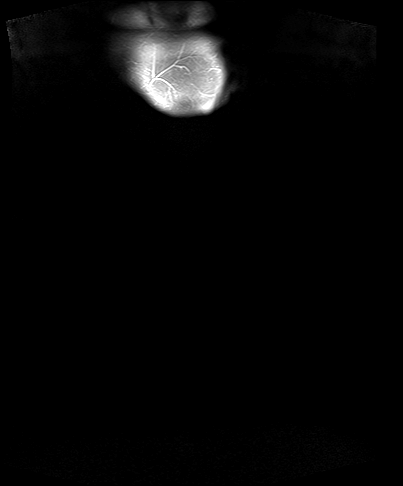

[Series 10: composed cor stir_comp_filt · coronal · right · 6.0mm · 1.25mm/px · 2 of 36 slices shown]
[im 1/36]
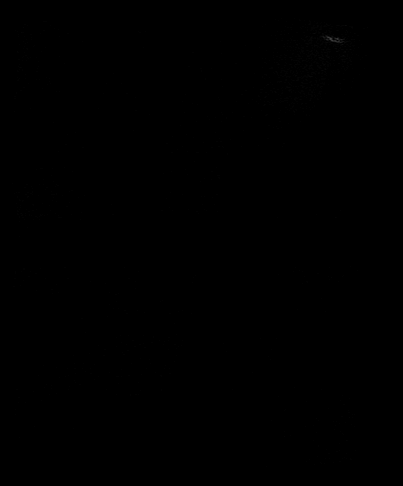
[im 36/36]
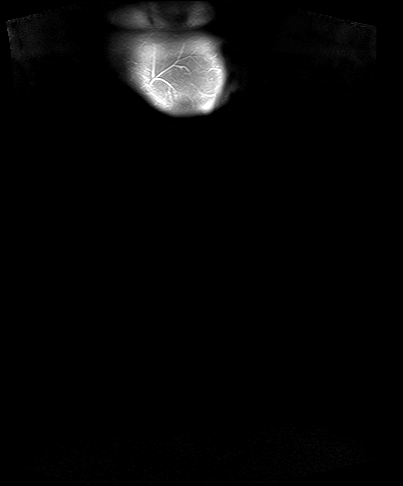

[Series 11: T2 fat-sat · sagittal · right · 4.0mm · 1.15mm/px · 2 of 35 slices shown (1 of 4)]
[im 1/35]
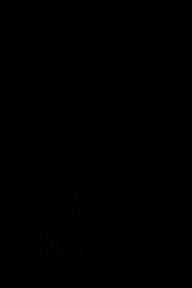
[im 35/35]
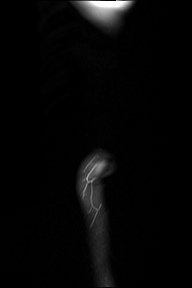

[Series 12: T2 fat-sat · sagittal · right · 4.0mm · 1.15mm/px · 2 of 35 slices shown (2 of 4)]
[im 1/35]
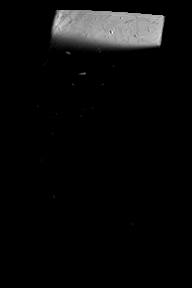
[im 35/35]
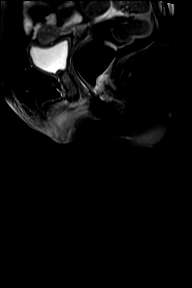

[Series 13: T2 · sagittal · right · 5.0mm · 1.15mm/px · 2 of 35 slices shown (1 of 2)]
[im 1/35]
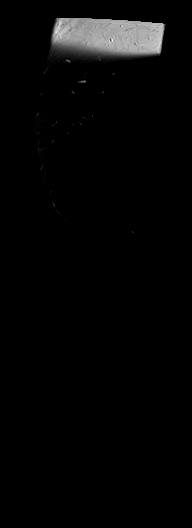
[im 35/35]
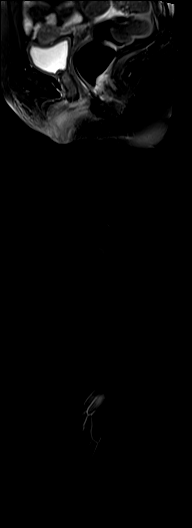

[Series 14: T1 · axial · right · 5.0mm · 0.70mm/px · z∈[-92,+171]mm · 2 of 45 slices shown (3 of 4)]
[im 1/45]
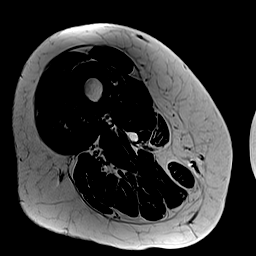
[im 45/45]
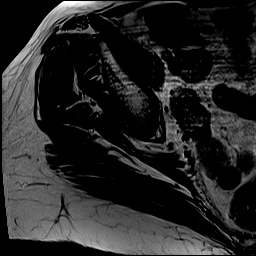

[Series 15: T1 · axial · right · 5.0mm · 0.70mm/px · z∈[-350,-87]mm · 2 of 45 slices shown (4 of 4)]
[im 1/45]
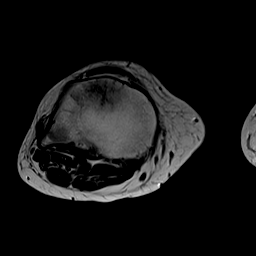
[im 45/45]
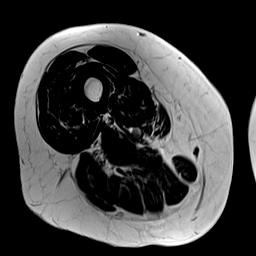

[Series 16: ax t1_comp · axial · right · 5.0mm · 0.70mm/px · z∈[-350,+171]mm · 5 of 88 slices shown]
[im 1/88]
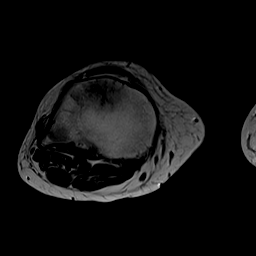
[im 22/88]
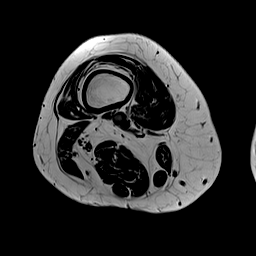
[im 44/88]
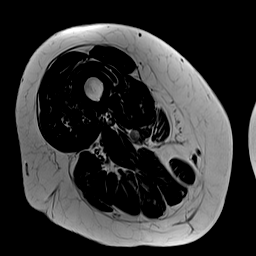
[im 66/88]
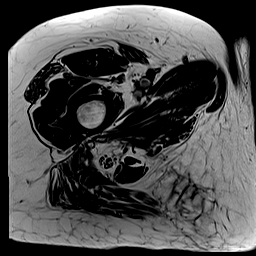
[im 88/88]
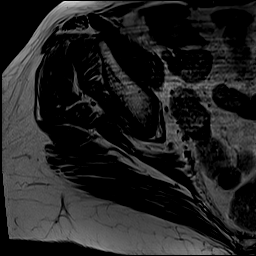

[Series 17: T2 fat-sat · axial · right · 5.0mm · 0.87mm/px · z∈[-92,+171]mm · 2 of 45 slices shown (3 of 4)]
[im 1/45]
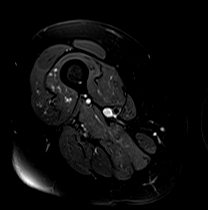
[im 45/45]
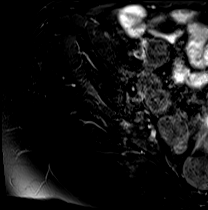

[Series 18: T2 fat-sat · axial · right · 5.0mm · 0.87mm/px · z∈[-350,-87]mm · 2 of 45 slices shown (4 of 4)]
[im 1/45]
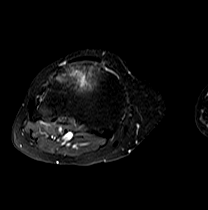
[im 45/45]
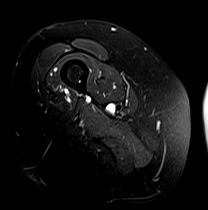

[Series 19: T2 · axial · right · 5.0mm · 0.87mm/px · z∈[-350,+171]mm · 5 of 88 slices shown (2 of 2)]
[im 1/88]
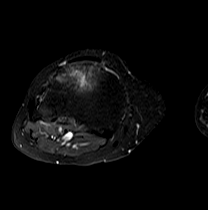
[im 22/88]
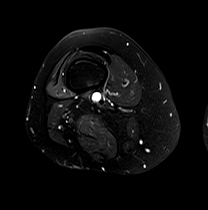
[im 44/88]
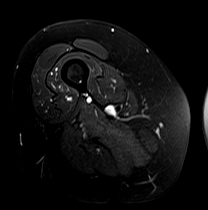
[im 66/88]
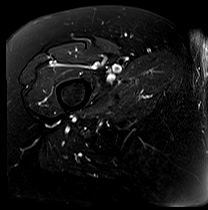
[im 88/88]
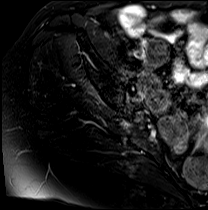

[40 of 40 positions shown; findings below may reference images not displayed]

FINDINGS: Both hips are normally located. Mild degenerative changes but no
stress fracture or AVN. No hip joint effusion. No periarticular
fluid collections to suggest a paralabral cyst. Mild bilateral
peritendinosis but no findings for trochanteric bursitis.

The pubic symphysis and visualized SI joints are intact. No pelvic
bone lesions or stress or insufficiency fracture.

Both femurs are normal. No femur fracture or bone lesion. The knee
joints are maintained. No joint effusion.

The hip and thigh musculature appear normal. No muscle tear,
myositis or mass. The subcutaneous tissues are unremarkable. No
inflammatory changes or mass. The hamstring tendons appear normal.

No significant intrapelvic abnormalities are identified.
IMPRESSION: 1. Mild bilateral hip joint degenerative changes but no stress
fracture or AVN.
2. Mild bilateral peritendinosis but no findings for trochanteric
bursitis.
3. Both femurs are normal.
4. Normal appearance of the hip and thigh musculature.

## 2021-03-05 DIAGNOSIS — G5603 Carpal tunnel syndrome, bilateral upper limbs: Secondary | ICD-10-CM | POA: Diagnosis not present

## 2021-03-05 DIAGNOSIS — M79642 Pain in left hand: Secondary | ICD-10-CM | POA: Diagnosis not present

## 2021-03-05 DIAGNOSIS — M720 Palmar fascial fibromatosis [Dupuytren]: Secondary | ICD-10-CM | POA: Diagnosis not present

## 2021-03-05 DIAGNOSIS — M79641 Pain in right hand: Secondary | ICD-10-CM | POA: Diagnosis not present

## 2021-05-14 DIAGNOSIS — M5430 Sciatica, unspecified side: Secondary | ICD-10-CM | POA: Diagnosis not present

## 2021-05-14 DIAGNOSIS — Z23 Encounter for immunization: Secondary | ICD-10-CM | POA: Diagnosis not present

## 2021-05-14 DIAGNOSIS — F5101 Primary insomnia: Secondary | ICD-10-CM | POA: Diagnosis not present

## 2021-05-14 DIAGNOSIS — R002 Palpitations: Secondary | ICD-10-CM | POA: Diagnosis not present

## 2021-05-14 DIAGNOSIS — R251 Tremor, unspecified: Secondary | ICD-10-CM | POA: Diagnosis not present

## 2021-05-14 DIAGNOSIS — E538 Deficiency of other specified B group vitamins: Secondary | ICD-10-CM | POA: Diagnosis not present

## 2021-05-14 DIAGNOSIS — F1021 Alcohol dependence, in remission: Secondary | ICD-10-CM | POA: Diagnosis not present

## 2021-05-14 DIAGNOSIS — F339 Major depressive disorder, recurrent, unspecified: Secondary | ICD-10-CM | POA: Diagnosis not present

## 2021-06-22 DIAGNOSIS — Z20822 Contact with and (suspected) exposure to covid-19: Secondary | ICD-10-CM | POA: Diagnosis not present

## 2021-11-11 DIAGNOSIS — M5430 Sciatica, unspecified side: Secondary | ICD-10-CM | POA: Diagnosis not present

## 2021-11-11 DIAGNOSIS — K589 Irritable bowel syndrome without diarrhea: Secondary | ICD-10-CM | POA: Diagnosis not present

## 2021-11-11 DIAGNOSIS — F5101 Primary insomnia: Secondary | ICD-10-CM | POA: Diagnosis not present

## 2021-11-11 DIAGNOSIS — R002 Palpitations: Secondary | ICD-10-CM | POA: Diagnosis not present

## 2021-11-11 DIAGNOSIS — M1612 Unilateral primary osteoarthritis, left hip: Secondary | ICD-10-CM | POA: Diagnosis not present

## 2021-11-11 DIAGNOSIS — F1021 Alcohol dependence, in remission: Secondary | ICD-10-CM | POA: Diagnosis not present

## 2021-11-11 DIAGNOSIS — E538 Deficiency of other specified B group vitamins: Secondary | ICD-10-CM | POA: Diagnosis not present

## 2021-11-11 DIAGNOSIS — R251 Tremor, unspecified: Secondary | ICD-10-CM | POA: Diagnosis not present

## 2021-11-11 DIAGNOSIS — F339 Major depressive disorder, recurrent, unspecified: Secondary | ICD-10-CM | POA: Diagnosis not present

## 2022-05-12 DIAGNOSIS — M1612 Unilateral primary osteoarthritis, left hip: Secondary | ICD-10-CM | POA: Diagnosis not present

## 2022-05-12 DIAGNOSIS — R319 Hematuria, unspecified: Secondary | ICD-10-CM | POA: Diagnosis not present

## 2022-05-12 DIAGNOSIS — R109 Unspecified abdominal pain: Secondary | ICD-10-CM | POA: Diagnosis not present

## 2022-05-12 DIAGNOSIS — M5430 Sciatica, unspecified side: Secondary | ICD-10-CM | POA: Diagnosis not present

## 2022-05-12 DIAGNOSIS — R002 Palpitations: Secondary | ICD-10-CM | POA: Diagnosis not present

## 2022-05-12 DIAGNOSIS — K589 Irritable bowel syndrome without diarrhea: Secondary | ICD-10-CM | POA: Diagnosis not present

## 2022-05-12 DIAGNOSIS — F1021 Alcohol dependence, in remission: Secondary | ICD-10-CM | POA: Diagnosis not present

## 2022-05-12 DIAGNOSIS — E538 Deficiency of other specified B group vitamins: Secondary | ICD-10-CM | POA: Diagnosis not present

## 2022-05-12 DIAGNOSIS — F339 Major depressive disorder, recurrent, unspecified: Secondary | ICD-10-CM | POA: Diagnosis not present

## 2022-05-12 DIAGNOSIS — F5101 Primary insomnia: Secondary | ICD-10-CM | POA: Diagnosis not present

## 2022-05-12 DIAGNOSIS — R251 Tremor, unspecified: Secondary | ICD-10-CM | POA: Diagnosis not present

## 2022-05-13 ENCOUNTER — Other Ambulatory Visit: Payer: Self-pay | Admitting: Family Medicine

## 2022-05-13 DIAGNOSIS — R10A0 Flank pain, unspecified side: Secondary | ICD-10-CM

## 2022-05-13 DIAGNOSIS — R109 Unspecified abdominal pain: Secondary | ICD-10-CM

## 2022-05-13 DIAGNOSIS — R319 Hematuria, unspecified: Secondary | ICD-10-CM

## 2022-05-18 ENCOUNTER — Ambulatory Visit
Admission: RE | Admit: 2022-05-18 | Discharge: 2022-05-18 | Disposition: A | Discharge status: Home / Self Care | Payer: 59 | Source: Ambulatory Visit | Attending: Family Medicine | Admitting: Family Medicine

## 2022-05-18 DIAGNOSIS — R319 Hematuria, unspecified: Secondary | ICD-10-CM | POA: Diagnosis not present

## 2022-05-18 DIAGNOSIS — R109 Unspecified abdominal pain: Secondary | ICD-10-CM | POA: Diagnosis not present

## 2022-05-18 DIAGNOSIS — R3129 Other microscopic hematuria: Secondary | ICD-10-CM | POA: Diagnosis not present

## 2022-11-26 DIAGNOSIS — R002 Palpitations: Secondary | ICD-10-CM | POA: Diagnosis not present

## 2022-11-26 DIAGNOSIS — K589 Irritable bowel syndrome without diarrhea: Secondary | ICD-10-CM | POA: Diagnosis not present

## 2022-11-26 DIAGNOSIS — F5101 Primary insomnia: Secondary | ICD-10-CM | POA: Diagnosis not present

## 2022-11-26 DIAGNOSIS — E7849 Other hyperlipidemia: Secondary | ICD-10-CM | POA: Diagnosis not present

## 2022-11-26 DIAGNOSIS — F339 Major depressive disorder, recurrent, unspecified: Secondary | ICD-10-CM | POA: Diagnosis not present

## 2022-11-26 DIAGNOSIS — M5430 Sciatica, unspecified side: Secondary | ICD-10-CM | POA: Diagnosis not present

## 2022-11-26 DIAGNOSIS — M720 Palmar fascial fibromatosis [Dupuytren]: Secondary | ICD-10-CM | POA: Diagnosis not present

## 2022-11-26 DIAGNOSIS — R251 Tremor, unspecified: Secondary | ICD-10-CM | POA: Diagnosis not present

## 2022-11-26 DIAGNOSIS — M1612 Unilateral primary osteoarthritis, left hip: Secondary | ICD-10-CM | POA: Diagnosis not present

## 2022-11-26 DIAGNOSIS — E538 Deficiency of other specified B group vitamins: Secondary | ICD-10-CM | POA: Diagnosis not present

## 2023-02-03 ENCOUNTER — Other Ambulatory Visit: Payer: Self-pay

## 2023-02-03 ENCOUNTER — Encounter: Payer: Self-pay | Admitting: *Deleted

## 2023-02-03 ENCOUNTER — Emergency Department
Admission: EM | Admit: 2023-02-03 | Discharge: 2023-02-03 | Disposition: A | Discharge status: Home / Self Care | Payer: Commercial Managed Care - PPO | Attending: Emergency Medicine | Admitting: Emergency Medicine

## 2023-02-03 ENCOUNTER — Emergency Department: Payer: Commercial Managed Care - PPO

## 2023-02-03 DIAGNOSIS — M7989 Other specified soft tissue disorders: Secondary | ICD-10-CM | POA: Diagnosis not present

## 2023-02-03 DIAGNOSIS — S63501A Unspecified sprain of right wrist, initial encounter: Secondary | ICD-10-CM | POA: Insufficient documentation

## 2023-02-03 DIAGNOSIS — M25521 Pain in right elbow: Secondary | ICD-10-CM | POA: Diagnosis not present

## 2023-02-03 DIAGNOSIS — M25562 Pain in left knee: Secondary | ICD-10-CM | POA: Insufficient documentation

## 2023-02-03 DIAGNOSIS — W541XXA Struck by dog, initial encounter: Secondary | ICD-10-CM | POA: Insufficient documentation

## 2023-02-03 DIAGNOSIS — M8580 Other specified disorders of bone density and structure, unspecified site: Secondary | ICD-10-CM | POA: Diagnosis not present

## 2023-02-03 DIAGNOSIS — M25531 Pain in right wrist: Secondary | ICD-10-CM | POA: Diagnosis not present

## 2023-02-03 DIAGNOSIS — S6991XA Unspecified injury of right wrist, hand and finger(s), initial encounter: Secondary | ICD-10-CM | POA: Diagnosis not present

## 2023-02-03 NOTE — ED Triage Notes (Signed)
Pt has right wrist pain.  Pt states thd dogs knocked her down.  Swelling noted.  Pt alert.

## 2023-02-03 NOTE — ED Provider Notes (Signed)
[facility] Provider Note    Event Date/Time   First MD Initiated Contact with Patient 02/03/23 2049     (approximate)   History   Wrist Pain   HPI  Miranda Petersen is a 62 y.o. female right-hand-dominant, who presents to the emergency department with right wrist pain after a fall.  Patient got tripped by one of her dogs and fell forward on outstretched arm to the right hand.  Pain to the right wrist and the elbow.  Mild pain to the left knee but states that she does not have concerns for a break and she has been able to ambulate with only minimal pain with movement.     Physical Exam   Triage Vital Signs: ED Triage Vitals  Encounter Vitals Group     BP 02/03/23 2048 (!) 145/88     Systolic BP Percentile --      Diastolic BP Percentile --      Pulse Rate 02/03/23 2048 100     Resp 02/03/23 2048 18     Temp 02/03/23 2048 98.9 F (37.2 C)     Temp Source 02/03/23 2048 Oral     SpO2 02/03/23 2048 100 %     Weight 02/03/23 2047 170 lb (77.1 kg)     Height 02/03/23 2047 5\' 10"  (1.778 m)     Head Circumference --      Peak Flow --      Pain Score 02/03/23 2046 5     Pain Loc --      Pain Education --      Exclude from Growth Chart --     Most recent vital signs: Vitals:   02/03/23 2048  BP: (!) 145/88  Pulse: 100  Resp: 18  Temp: 98.9 F (37.2 C)  SpO2: 100%    Physical Exam Constitutional:      Appearance: She is well-developed.  HENT:     Head: Atraumatic.  Eyes:     Conjunctiva/sclera: Conjunctivae normal.  Cardiovascular:     Rate and Rhythm: Regular rhythm.  Pulmonary:     Effort: No respiratory distress.  Abdominal:     General: There is no distension.  Musculoskeletal:     Cervical back: Normal range of motion. No tenderness.     Comments: Right wrist with swelling.  Mild snuffbox tenderness.  +2 radial pulses.  Good capillary refill.  Full flexion extension at DIP and PIP.  Mild tenderness to the elbow.  No tenderness to  bilateral shoulders or lower extremities.  Skin:    General: Skin is warm.  Neurological:     Mental Status: She is alert. Mental status is at baseline.      IMPRESSION / MDM / ASSESSMENT AND PLAN / ED COURSE  I reviewed the triage vital signs and the nursing notes.  Differential diagnosis including fracture, dislocation, scaphoid fracture, musculoskeletal strain   RADIOLOGY I independently reviewed imaging, my interpretation of imaging: X-ray imaging without acute fracture or dislocation.  Read as no acute findings.   Labs (all labs ordered are listed, but only abnormal results are displayed) Labs interpreted as -    Labs Reviewed - No data to display    Given patient's snuffbox tenderness patient was placed in a wrist splint.  Discussed NSAIDs and Tylenol.  Discussed follow-up with primary care physician in 1 week and if did not have any improvement of her pain would need repeat x-ray imaging.  Given return precautions for any  worsening symptoms.  Patient neurovascularly intact.   PROCEDURES:  Critical Care performed: No  Procedures  Patient's presentation is most consistent with acute complicated illness / injury requiring diagnostic workup.   MEDICATIONS ORDERED IN ED: Medications - No data to display  FINAL CLINICAL IMPRESSION(S) / ED DIAGNOSES   Final diagnoses:  Sprain of right wrist, initial encounter     Rx / DC Orders   ED Discharge Orders     None        Note:  This document was prepared using Dragon voice recognition software and may include unintentional dictation errors.   Corena Herter, MD 02/04/23 [phone]

## 2023-02-03 NOTE — Discharge Instructions (Signed)
You were seen in the emergency department after having a fall.  You had an x-ray of your right wrist and elbow that did not show any broken bones.  On exam you had tenderness to your wrist in the location that can sometimes have a fracture that does not show up on initial imaging (scaphoid bone), if you continue to have wrist pain follow-up in 1 week with your primary care physician so they can repeat x-rays.  Apply ice, elevate your wrist and alternate Motrin and Tylenol for pain control.  Return to the emergency department for any worsening symptoms.

## 2023-02-05 DIAGNOSIS — R030 Elevated blood-pressure reading, without diagnosis of hypertension: Secondary | ICD-10-CM | POA: Diagnosis not present

## 2023-02-05 DIAGNOSIS — M25531 Pain in right wrist: Secondary | ICD-10-CM | POA: Diagnosis not present

## 2023-02-05 DIAGNOSIS — Z6825 Body mass index (BMI) 25.0-25.9, adult: Secondary | ICD-10-CM | POA: Diagnosis not present

## 2023-02-16 DIAGNOSIS — G5622 Lesion of ulnar nerve, left upper limb: Secondary | ICD-10-CM | POA: Diagnosis not present

## 2023-02-16 DIAGNOSIS — G5603 Carpal tunnel syndrome, bilateral upper limbs: Secondary | ICD-10-CM | POA: Diagnosis not present

## 2023-02-16 DIAGNOSIS — G5621 Lesion of ulnar nerve, right upper limb: Secondary | ICD-10-CM | POA: Diagnosis not present

## 2023-02-18 DIAGNOSIS — G5603 Carpal tunnel syndrome, bilateral upper limbs: Secondary | ICD-10-CM | POA: Diagnosis not present

## 2023-08-05 NOTE — Progress Notes (Unsigned)
 There were no vitals taken for this visit.   Subjective:    Patient ID: Miranda Petersen, female    DOB: 07-01-1960, 63 y.o.   MRN: [id number]  HPI: Miranda Petersen is a 63 y.o. female  No chief complaint on file.   Discussed the use of AI scribe software for clinical note transcription with the patient, who gave verbal consent to proceed.  History of Present Illness            No data to display          Relevant past medical, surgical, family and social history reviewed and updated as indicated. Interim medical history since our last visit reviewed. Allergies and medications reviewed and updated.  Review of Systems  Per HPI unless specifically indicated above     Objective:    There were no vitals taken for this visit.  {Vitals History (Optional):23777} Wt Readings from Last 3 Encounters:  02/03/23 170 lb (77.1 kg)  07/18/20 155 lb (70.3 kg)  12/19/19 160 lb (72.6 kg)    Physical Exam  Results for orders placed or performed in visit on 04/17/19  TSH   Collection Time: 04/17/19  4:16 PM  Result Value Ref Range   TSH 1.550 0.450 - 4.500 uIU/mL  Sedimentation Rate   Collection Time: 04/17/19  4:16 PM  Result Value Ref Range   Sed Rate 2 0 - 40 mm/hr  RPR   Collection Time: 04/17/19  4:16 PM  Result Value Ref Range   RPR Ser Ql Non Reactive Non Reactive  CMP   Collection Time: 04/17/19  4:16 PM  Result Value Ref Range   Glucose 95 65 - 99 mg/dL   BUN 8 6 - 24 mg/dL   Creatinine, Ser 7.82 0.57 - 1.00 mg/dL   GFR calc non Af Amer 92 >59 mL/min/1.73   GFR calc Af Amer 106 >59 mL/min/1.73   BUN/Creatinine Ratio 11 9 - 23   Sodium 143 134 - 144 mmol/L   Potassium 4.4 3.5 - 5.2 mmol/L   Chloride 102 96 - 106 mmol/L   CO2 27 20 - 29 mmol/L   Calcium 9.9 8.7 - 10.2 mg/dL   Total Protein 6.8 6.0 - 8.5 g/dL   Albumin 4.5 3.8 - 4.9 g/dL   Globulin, Total 2.3 1.5 - 4.5 g/dL   Albumin/Globulin Ratio 2.0 1.2 - 2.2   Bilirubin Total 0.5 0.0 - 1.2 mg/dL    Alkaline Phosphatase 74 39 - 117 IU/L   AST 17 0 - 40 IU/L   ALT 16 0 - 32 IU/L  B12 and Folate Panel   Collection Time: 04/17/19  4:16 PM  Result Value Ref Range   Vitamin B-12 102 (L) 232 - 1,245 pg/mL   Folate 5.3 >3.0 ng/mL  CBC with Differential/Platelet   Collection Time: 04/17/19  4:16 PM  Result Value Ref Range   WBC 5.9 3.4 - 10.8 x10E3/uL   RBC 4.37 3.77 - 5.28 x10E6/uL   Hemoglobin 13.6 11.1 - 15.9 g/dL   Hematocrit 95.6 21.3 - 46.6 %   MCV 94 79 - 97 fL   MCH 31.1 26.6 - 33.0 pg   MCHC 33.0 31.5 - 35.7 g/dL   RDW 08.6 57.8 - 46.9 %   Platelets 244 150 - 450 x10E3/uL   Neutrophils 61 Not Estab. %   Lymphs 32 Not Estab. %   Monocytes 5 Not Estab. %   Eos 1 Not Estab. %   Basos 1  Not Estab. %   Neutrophils Absolute 3.7 1.4 - 7.0 x10E3/uL   Lymphocytes Absolute 1.9 0.7 - 3.1 x10E3/uL   Monocytes Absolute 0.3 0.1 - 0.9 x10E3/uL   EOS (ABSOLUTE) 0.1 0.0 - 0.4 x10E3/uL   Basophils Absolute 0.0 0.0 - 0.2 x10E3/uL   Immature Granulocytes 0 Not Estab. %   Immature Grans (Abs) 0.0 0.0 - 0.1 x10E3/uL   {Labs (Optional):23779}    Assessment & Plan:   Problem List Items Addressed This Visit   None    Assessment and Plan             Follow up plan: No follow-ups on file.

## 2023-08-06 ENCOUNTER — Ambulatory Visit: Payer: Medicaid Other | Admitting: Nurse Practitioner

## 2023-08-06 ENCOUNTER — Encounter: Payer: Self-pay | Admitting: Nurse Practitioner

## 2023-08-06 ENCOUNTER — Other Ambulatory Visit: Payer: Self-pay

## 2023-08-06 VITALS — BP 124/82 | HR 82 | Temp 98.1°F | Resp 16 | Ht 70.0 in | Wt 174.3 lb

## 2023-08-06 DIAGNOSIS — M24549 Contracture, unspecified hand: Secondary | ICD-10-CM

## 2023-08-06 DIAGNOSIS — M797 Fibromyalgia: Secondary | ICD-10-CM | POA: Diagnosis not present

## 2023-08-06 DIAGNOSIS — F5101 Primary insomnia: Secondary | ICD-10-CM | POA: Diagnosis not present

## 2023-08-06 DIAGNOSIS — G5603 Carpal tunnel syndrome, bilateral upper limbs: Secondary | ICD-10-CM | POA: Insufficient documentation

## 2023-08-06 DIAGNOSIS — Z13220 Encounter for screening for lipoid disorders: Secondary | ICD-10-CM | POA: Diagnosis not present

## 2023-08-06 DIAGNOSIS — G4700 Insomnia, unspecified: Secondary | ICD-10-CM | POA: Insufficient documentation

## 2023-08-06 DIAGNOSIS — Z114 Encounter for screening for human immunodeficiency virus [HIV]: Secondary | ICD-10-CM | POA: Diagnosis not present

## 2023-08-06 DIAGNOSIS — E538 Deficiency of other specified B group vitamins: Secondary | ICD-10-CM

## 2023-08-06 DIAGNOSIS — L659 Nonscarring hair loss, unspecified: Secondary | ICD-10-CM | POA: Diagnosis not present

## 2023-08-06 DIAGNOSIS — M1990 Unspecified osteoarthritis, unspecified site: Secondary | ICD-10-CM | POA: Insufficient documentation

## 2023-08-06 DIAGNOSIS — R251 Tremor, unspecified: Secondary | ICD-10-CM

## 2023-08-06 DIAGNOSIS — Z130 Encounter for screening for diseases of the blood and blood-forming organs and certain disorders involving the immune mechanism: Secondary | ICD-10-CM

## 2023-08-06 DIAGNOSIS — Z1159 Encounter for screening for other viral diseases: Secondary | ICD-10-CM

## 2023-08-06 DIAGNOSIS — M720 Palmar fascial fibromatosis [Dupuytren]: Secondary | ICD-10-CM

## 2023-08-06 DIAGNOSIS — Z131 Encounter for screening for diabetes mellitus: Secondary | ICD-10-CM

## 2023-08-06 DIAGNOSIS — Z1211 Encounter for screening for malignant neoplasm of colon: Secondary | ICD-10-CM

## 2023-08-06 DIAGNOSIS — Z7689 Persons encountering health services in other specified circumstances: Secondary | ICD-10-CM

## 2023-08-06 DIAGNOSIS — Z1231 Encounter for screening mammogram for malignant neoplasm of breast: Secondary | ICD-10-CM

## 2023-08-06 DIAGNOSIS — F330 Major depressive disorder, recurrent, mild: Secondary | ICD-10-CM | POA: Diagnosis not present

## 2023-08-09 ENCOUNTER — Other Ambulatory Visit: Payer: Self-pay | Admitting: Nurse Practitioner

## 2023-08-09 ENCOUNTER — Encounter: Payer: Self-pay | Admitting: Nurse Practitioner

## 2023-08-09 DIAGNOSIS — L659 Nonscarring hair loss, unspecified: Secondary | ICD-10-CM

## 2023-08-10 LAB — COMPLETE METABOLIC PANEL WITH GFR
AG Ratio: 1.8 (calc) (ref 1.0–2.5)
ALT: 14 U/L (ref 6–29)
AST: 18 U/L (ref 10–35)
Albumin: 4.2 g/dL (ref 3.6–5.1)
Alkaline phosphatase (APISO): 61 U/L (ref 37–153)
BUN: 7 mg/dL (ref 7–25)
CO2: 32 mmol/L (ref 20–32)
Calcium: 9.6 mg/dL (ref 8.6–10.4)
Chloride: 104 mmol/L (ref 98–110)
Creat: 0.73 mg/dL (ref 0.50–1.05)
Globulin: 2.3 g/dL (ref 1.9–3.7)
Glucose, Bld: 76 mg/dL (ref 65–99)
Potassium: 4.6 mmol/L (ref 3.5–5.3)
Sodium: 142 mmol/L (ref 135–146)
Total Bilirubin: 0.6 mg/dL (ref 0.2–1.2)
Total Protein: 6.5 g/dL (ref 6.1–8.1)
eGFR: 94 mL/min/{1.73_m2} (ref >=60)

## 2023-08-10 LAB — HIV ANTIBODY (ROUTINE TESTING W REFLEX): HIV 1&2 Ab, 4th Generation: NONREACTIVE

## 2023-08-10 LAB — CBC WITH DIFFERENTIAL/PLATELET
Absolute Lymphocytes: 2095 {cells}/uL (ref 850–3900)
Absolute Monocytes: 329 {cells}/uL (ref 200–950)
Basophils Absolute: 22 {cells}/uL (ref 0–200)
Basophils Relative: 0.4 %
Eosinophils Absolute: 119 {cells}/uL (ref 15–500)
Eosinophils Relative: 2.2 %
HCT: 38.5 % (ref 35.0–45.0)
Hemoglobin: 12.8 g/dL (ref 11.7–15.5)
MCH: 31.2 pg (ref 27.0–33.0)
MCHC: 33.2 g/dL (ref 32.0–36.0)
MCV: 93.9 fL (ref 80.0–100.0)
MPV: 10.5 fL (ref 7.5–12.5)
Monocytes Relative: 6.1 %
Neutro Abs: 2835 {cells}/uL (ref 1500–7800)
Neutrophils Relative %: 52.5 %
Platelets: 270 10*3/uL (ref 140–400)
RBC: 4.1 10*6/uL (ref 3.80–5.10)
RDW: 11.5 % (ref 11.0–15.0)
Total Lymphocyte: 38.8 %
WBC: 5.4 10*3/uL (ref 3.8–10.8)

## 2023-08-10 LAB — VITAMIN B12: Vitamin B-12: 167 pg/mL — ABNORMAL LOW (ref 200–1100)

## 2023-08-10 LAB — HEPATITIS C ANTIBODY: Hepatitis C Ab: NONREACTIVE

## 2023-08-10 LAB — LIPID PANEL
Cholesterol: 283 mg/dL — ABNORMAL HIGH (ref <200)
HDL: 74 mg/dL (ref >=50)
LDL Cholesterol (Calc): 191 mg/dL — ABNORMAL HIGH
Non-HDL Cholesterol (Calc): 209 mg/dL — ABNORMAL HIGH (ref <130)
Total CHOL/HDL Ratio: 3.8 (calc) (ref <5.0)
Triglycerides: 77 mg/dL (ref <150)

## 2023-08-10 LAB — TEST AUTHORIZATION

## 2023-08-10 LAB — HEMOGLOBIN A1C
Hgb A1c MFr Bld: 5.1 %{Hb} (ref <5.7)
Mean Plasma Glucose: 100 mg/dL
eAG (mmol/L): 5.5 mmol/L

## 2023-08-10 LAB — TSH: TSH: 2.65 m[IU]/L (ref 0.40–4.50)

## 2023-08-12 DIAGNOSIS — Z1211 Encounter for screening for malignant neoplasm of colon: Secondary | ICD-10-CM | POA: Diagnosis not present

## 2023-08-13 ENCOUNTER — Ambulatory Visit

## 2023-08-13 ENCOUNTER — Ambulatory Visit: Admitting: Nurse Practitioner

## 2023-08-13 DIAGNOSIS — E538 Deficiency of other specified B group vitamins: Secondary | ICD-10-CM

## 2023-08-13 MED ORDER — CYANOCOBALAMIN 1000 MCG/ML IJ SOLN
1000.0000 ug | Freq: Once | INTRAMUSCULAR | Status: AC
  Administered 2023-08-13: 1000 ug via INTRAMUSCULAR

## 2023-08-13 NOTE — Progress Notes (Signed)
 Patient is in office today for a nurse visit for B12 Injection. Patient Injection was given in the  Left deltoid. Patient tolerated injection well.

## 2023-08-20 LAB — COLOGUARD: COLOGUARD: NEGATIVE

## 2023-10-04 ENCOUNTER — Ambulatory Visit: Admitting: Nurse Practitioner

## 2023-10-04 ENCOUNTER — Other Ambulatory Visit: Payer: Self-pay | Admitting: Nurse Practitioner

## 2023-10-04 ENCOUNTER — Ambulatory Visit (INDEPENDENT_AMBULATORY_CARE_PROVIDER_SITE_OTHER)

## 2023-10-04 DIAGNOSIS — Z1231 Encounter for screening mammogram for malignant neoplasm of breast: Secondary | ICD-10-CM

## 2023-10-04 DIAGNOSIS — E538 Deficiency of other specified B group vitamins: Secondary | ICD-10-CM | POA: Diagnosis not present

## 2023-10-04 MED ORDER — CYANOCOBALAMIN 1000 MCG/ML IJ SOLN
1000.0000 ug | Freq: Once | INTRAMUSCULAR | Status: AC
  Administered 2023-10-04: 1000 ug via INTRAMUSCULAR

## 2023-10-04 NOTE — Progress Notes (Signed)
 Patient is in office today for a nurse visit for B12 Injection. Patient Injection was given in the  Left deltoid. Patient tolerated injection well.

## 2023-10-11 ENCOUNTER — Encounter

## 2023-10-19 ENCOUNTER — Encounter

## 2023-10-27 ENCOUNTER — Ambulatory Visit
Admission: RE | Admit: 2023-10-27 | Discharge: 2023-10-27 | Disposition: A | Discharge status: Home / Self Care | Source: Ambulatory Visit | Attending: Nurse Practitioner | Admitting: Nurse Practitioner

## 2023-10-27 DIAGNOSIS — Z1231 Encounter for screening mammogram for malignant neoplasm of breast: Secondary | ICD-10-CM | POA: Diagnosis not present

## 2023-10-30 ENCOUNTER — Ambulatory Visit
Admission: RE | Admit: 2023-10-30 | Discharge: 2023-10-30 | Disposition: A | Discharge status: Home / Self Care | Source: Ambulatory Visit | Attending: Emergency Medicine | Admitting: Emergency Medicine

## 2023-10-30 ENCOUNTER — Ambulatory Visit (INDEPENDENT_AMBULATORY_CARE_PROVIDER_SITE_OTHER)

## 2023-10-30 ENCOUNTER — Other Ambulatory Visit: Payer: Self-pay

## 2023-10-30 ENCOUNTER — Telehealth: Payer: Self-pay | Admitting: Emergency Medicine

## 2023-10-30 VITALS — BP 130/85 | HR 100 | Temp 98.6°F | Resp 20

## 2023-10-30 DIAGNOSIS — S61232A Puncture wound without foreign body of right middle finger without damage to nail, initial encounter: Secondary | ICD-10-CM | POA: Diagnosis not present

## 2023-10-30 DIAGNOSIS — S61451A Open bite of right hand, initial encounter: Secondary | ICD-10-CM

## 2023-10-30 DIAGNOSIS — M7989 Other specified soft tissue disorders: Secondary | ICD-10-CM | POA: Diagnosis not present

## 2023-10-30 DIAGNOSIS — M79641 Pain in right hand: Secondary | ICD-10-CM | POA: Diagnosis not present

## 2023-10-30 DIAGNOSIS — S61234A Puncture wound without foreign body of right ring finger without damage to nail, initial encounter: Secondary | ICD-10-CM

## 2023-10-30 DIAGNOSIS — S61236A Puncture wound without foreign body of right little finger without damage to nail, initial encounter: Secondary | ICD-10-CM

## 2023-10-30 DIAGNOSIS — S61259A Open bite of unspecified finger without damage to nail, initial encounter: Secondary | ICD-10-CM

## 2023-10-30 DIAGNOSIS — W540XXA Bitten by dog, initial encounter: Secondary | ICD-10-CM | POA: Diagnosis not present

## 2023-10-30 MED ORDER — AMOXICILLIN-POT CLAVULANATE 875-125 MG PO TABS: 1.0000 | ORAL_TABLET | Freq: Two times a day (BID) | ORAL | 0 refills | Status: DC

## 2023-10-30 NOTE — ED Triage Notes (Signed)
 PT reports her dogs were fighting yesterday and when Pt stopped fight fight she was bite on RT middle,Rt ring ,RT small finger.

## 2023-10-30 NOTE — ED Provider Notes (Signed)
 Arlander Bellman    CSN: [id number] Arrival date & time: 10/30/23  1055      History   Chief Complaint Chief Complaint  Patient presents with   Finger Injury    Entered by patient    HPI Miranda Petersen is a 63 y.o. female.   Presents for evaluation of a dog bite present to the right 3rd through 5th finger beginning 1 day ago.  There is her swelling and she is having difficulty completing range of motion.  Soaked in peroxide and applied topical antibiotic ointment.  Her dogs were in a fight and she attempted to break them up.  Dog up-to-date on vaccines.  Last tetanus within the last 5 years.  Numbness or tingling.  Past Medical History:  Diagnosis Date   Arthritis    Contracture of hand    Fibromyalgia    Insomnia    Nonspecific abnormal results of cardiovascular function study 07/09/2010   Subsequent cardiac catheterization demonstrating normal coronary arteries with exception of myocardial LAD bridging   Occasional tremors    Syndactyly    Status post surgical repair    Patient Active Problem List   Diagnosis Date Noted   Dupuytren's contracture of both hands 08/06/2023   Mild episode of recurrent major depressive disorder (HCC) 08/06/2023   Tremor 08/06/2023   Bilateral carpal tunnel syndrome 08/06/2023   Vitamin B 12 deficiency 08/06/2023   Fibromyalgia    Insomnia    Arthritis    Contracture of hand    CHEST PAIN 07/30/2010   Nonspecific abnormal results of cardiovascular function study 07/30/2010    Past Surgical History:  Procedure Laterality Date   DILATION AND CURETTAGE OF UTERUS     LUMBAR LAMINECTOMY/DECOMPRESSION MICRODISCECTOMY  12/2019   REPAIR OF SYNDACTYLY HAND     TONSILLECTOMY     TUBAL LIGATION      OB History   No obstetric history on file.      Home Medications    Prior to Admission medications   Medication Sig Start Date End Date Taking? Authorizing Provider  amoxicillin-clavulanate (AUGMENTIN) 875-125 MG tablet Take 1  tablet by mouth every 12 (twelve) hours. 10/30/23  Yes Asuka Dusseau R, NP  cyclobenzaprine (FLEXERIL) 10 MG tablet Take 10 mg by mouth at bedtime. 10/05/17  Yes [provider]  famotidine (PEPCID) 10 MG tablet Take 10 mg by mouth 2 (two) times daily. OTC PRN   Yes [provider]  PARoxetine  (PAXIL ) 20 MG tablet Take 1 tablet (20 mg total) by mouth at bedtime. 06/26/19  Yes Athar, Saima, MD  propranolol  (INDERAL ) 10 MG tablet TAKE 1 TABLET BY MOUTH THREE TIMES A DAY AS NEEDED 02/05/20  Yes Lomax, Amy, NP  traMADol (ULTRAM) 50 MG tablet Take 50 mg by mouth every 8 (eight) hours as needed. Maximum dose= 8 tablets per day   Yes [provider]  zolpidem (AMBIEN) 10 MG tablet Take 10 mg by mouth at bedtime as needed for sleep.   Yes [provider]    Family History Family History  Problem Relation Age of Onset   Breast cancer Mother 39   Hypertension Mother    Atrial fibrillation Mother    Hypertension Father    Valvular heart disease Sister    Hypertension Brother     Social History Social History   Tobacco Use   Smoking status: Never   Smokeless tobacco: Never  Vaping Use   Vaping status: Never Used  Substance Use Topics  Alcohol  use: Not Currently    Comment: Socially   Drug use: No     Allergies   Codeine   Review of Systems Review of Systems   Physical Exam Triage Vital Signs ED Triage Vitals  Encounter Vitals Group     BP 10/30/23 1127 130/85     Systolic BP Percentile --      Diastolic BP Percentile --      Pulse Rate 10/30/23 1127 100     Resp 10/30/23 1127 20     Temp 10/30/23 1127 98.6 F (37 C)     Temp src --      SpO2 10/30/23 1127 97 %     Weight --      Height --      Head Circumference --      Peak Flow --      Pain Score 10/30/23 1126 3     Pain Loc --      Pain Education --      Exclude from Growth Chart --    No data found.  Updated Vital Signs BP 130/85   Pulse 100   Temp 98.6 F (37 C)    Resp 20   SpO2 97%   Visual Acuity Right Eye Distance:   Left Eye Distance:   Bilateral Distance:    Right Eye Near:   Left Eye Near:    Bilateral Near:     Physical Exam Constitutional:      Appearance: Normal appearance.  Eyes:     Extraocular Movements: Extraocular movements intact.  Pulmonary:     Effort: Pulmonary effort is normal.  Musculoskeletal:     Comments: Puncture wound present to the lateral aspect of the right middle finger, puncture wound present to the middle phalanx of the right middle finger going through the finger, moderate to severe swelling, sensation intact, capillary refill less than 3, deformity present  Puncture wound present to the lateral aspect of the right fourth finger with mild swelling, no ecchymosis or deformity,  Puncture wound present to the lateral aspect of the right little finger with swelling, no ecchymosis or deformity  Neurological:     Mental Status: She is alert and oriented to person, place, and time.      UC Treatments / Results  Labs (all labs ordered are listed, but only abnormal results are displayed) Labs Reviewed - No data to display  EKG   Radiology No results found.  Procedures Procedures (including critical care time)  Medications Ordered in UC Medications - No data to display  Initial Impression / Assessment and Plan / UC Course  I have reviewed the triage vital signs and the nursing notes.  Pertinent labs & imaging results that were available during my care of the patient were reviewed by me and considered in my medical decision making (see chart for details).  Puncture Wounds of the right middle finger, right ring finger and right little finger without foreign body and without involvement of the nail, dog bite of multiple sites of the right hand and finger  X-ray pending, deferring tetanus and rabies, Augmentin prescribed advised daily cleansing and covering with a Band-Aid to decrease risk for further  contamination, may use over-the-counter analgesics for pain and advise follow-up for any further concerns Final Clinical Impressions(s) / UC Diagnoses   Final diagnoses:  Dog bite of multiple sites of right hand and fingers, initial encounter  Puncture wound of right middle finger without foreign body without damage to  nail, initial encounter  Puncture wound of right ring finger without foreign body without damage to nail, initial encounter  Puncture wound of right little finger   Discharge Instructions      X-rays pending, you will be notified of results via telephone  Last tetanus was within 5 years therefore you will not need today  Dog is up-to-date on vaccines and therefore you would not need rabies series  Dog's mouth are considered to be dirty therefore begin Augmentin  twice daily for 7 days to keep area from becoming infected  Cleanse daily with unscented soap and water, pat and do not rub  May cover with a nonstick Band-Aid until healed to keep as clean as possible  You may take Tylenol and/or ibuprofen as needed for pain and help to reduce swelling  You may apply ice over the affected area or heat whichever you find comforting  If there is a break in the bone we will have you return to clinic for splinting  If there is a break in the bone then you will follow-up with orthopedics in 1 to 2 weeks for reevaluation, information is on front page  ED Prescriptions     Medication Sig Dispense Auth. Provider   amoxicillin -clavulanate (AUGMENTIN ) 875-125 MG tablet Take 1 tablet by mouth every 12 (twelve) hours. 14 tablet Jamion Carter R, NP      PDMP not reviewed this encounter.   Reena Canning, NP 10/30/23 1158

## 2023-10-30 NOTE — Discharge Instructions (Addendum)
 X-rays pending, you will be notified of results via telephone  Last tetanus was within 5 years therefore you will not need today  Dog is up-to-date on vaccines and therefore you would not need rabies series  Dog's mouth are considered to be dirty therefore begin Augmentin twice daily for 7 days to keep area from becoming infected  Cleanse daily with unscented soap and water, pat and do not rub  May cover with a nonstick Band-Aid until healed to keep as clean as possible  You may take Tylenol and/or ibuprofen as needed for pain and help to reduce swelling  You may apply ice over the affected area or heat whichever you find comforting  If there is a break in the bone we will have you return to clinic for splinting  If there is a break in the bone then you will follow-up with orthopedics in 1 to 2 weeks for reevaluation, information is on front page

## 2023-10-30 NOTE — Telephone Encounter (Signed)
 X-ray negative, reported via telephone, 2 patient identifiers used

## 2023-11-02 ENCOUNTER — Ambulatory Visit (HOSPITAL_COMMUNITY): Payer: Self-pay

## 2023-11-05 DIAGNOSIS — H33312 Horseshoe tear of retina without detachment, left eye: Secondary | ICD-10-CM | POA: Diagnosis not present

## 2023-11-05 DIAGNOSIS — H2513 Age-related nuclear cataract, bilateral: Secondary | ICD-10-CM | POA: Diagnosis not present

## 2023-11-05 DIAGNOSIS — H33313 Horseshoe tear of retina without detachment, bilateral: Secondary | ICD-10-CM | POA: Diagnosis not present

## 2023-11-05 DIAGNOSIS — H33002 Unspecified retinal detachment with retinal break, left eye: Secondary | ICD-10-CM | POA: Diagnosis not present

## 2023-11-05 DIAGNOSIS — H43813 Vitreous degeneration, bilateral: Secondary | ICD-10-CM | POA: Diagnosis not present

## 2023-11-08 ENCOUNTER — Encounter: Payer: Self-pay | Admitting: Nurse Practitioner

## 2023-11-08 MED ORDER — ZOLPIDEM TARTRATE 10 MG PO TABS: 10.0000 mg | ORAL_TABLET | Freq: Every evening | ORAL | 1 refills | Status: DC | PRN

## 2023-11-11 DIAGNOSIS — H33312 Horseshoe tear of retina without detachment, left eye: Secondary | ICD-10-CM | POA: Diagnosis not present

## 2023-11-16 ENCOUNTER — Ambulatory Visit: Admitting: Nurse Practitioner

## 2023-11-16 NOTE — Progress Notes (Deleted)
 There were no vitals taken for this visit.   Subjective:    Patient ID: Miranda Petersen, female    DOB: [birth year], 63 y.o.   MRN: [id number]  HPI: Miranda Petersen is a 63 y.o. female  No chief complaint on file.   Discussed the use of AI scribe software for clinical note transcription with the patient, who gave verbal consent to proceed.  History of Present Illness          08/06/2023    7:42 AM  Depression screen PHQ 2/9  Decreased Interest 0  Down, Depressed, Hopeless 0  PHQ - 2 Score 0  Altered sleeping 1  Tired, decreased energy 0  Change in appetite 0  Feeling bad or failure about yourself  0  Trouble concentrating 0  Moving slowly or fidgety/restless 0  Suicidal thoughts 0  PHQ-9 Score 1  Difficult doing work/chores Not difficult at all    Relevant past medical, surgical, family and social history reviewed and updated as indicated. Interim medical history since our last visit reviewed. Allergies and medications reviewed and updated.  Review of Systems  Per HPI unless specifically indicated above     Objective:      There were no vitals taken for this visit.  {Vitals History (Optional):23777} Wt Readings from Last 3 Encounters:  08/06/23 174 lb 4.8 oz (79.1 kg)  02/03/23 170 lb (77.1 kg)  07/18/20 155 lb (70.3 kg)    Physical Exam Physical Exam    Results for orders placed or performed in visit on 08/06/23  CBC with Differential/Platelet   Collection Time: 08/06/23  8:07 AM  Result Value Ref Range   WBC 5.4 3.8 - 10.8 Thousand/uL   RBC 4.10 3.80 - 5.10 Million/uL   Hemoglobin 12.8 11.7 - 15.5 g/dL   HCT 47.4 25.9 - 56.3 %   MCV 93.9 80.0 - 100.0 fL   MCH 31.2 27.0 - 33.0 pg   MCHC 33.2 32.0 - 36.0 g/dL   RDW 87.5 64.3 - 32.9 %   Platelets 270 140 - 400 Thousand/uL   MPV 10.5 7.5 - 12.5 fL   Neutro Abs 2,835 1,500 - 7,800 cells/uL   Absolute Lymphocytes 2,095 850 - 3,900 cells/uL   Absolute Monocytes 329 200 - 950 cells/uL   Eosinophils  Absolute 119 15 - 500 cells/uL   Basophils Absolute 22 0 - 200 cells/uL   Neutrophils Relative % 52.5 %   Total Lymphocyte 38.8 %   Monocytes Relative 6.1 %   Eosinophils Relative 2.2 %   Basophils Relative 0.4 %  COMPLETE METABOLIC PANEL WITH GFR   Collection Time: 08/06/23  8:07 AM  Result Value Ref Range   Glucose, Bld 76 65 - 99 mg/dL   BUN 7 7 - 25 mg/dL   Creat 5.18 8.41 - 6.60 mg/dL   eGFR 94 > OR = 60 YT/KZS/0.10X3   BUN/Creatinine Ratio SEE NOTE: 6 - 22 (calc)   Sodium 142 135 - 146 mmol/L   Potassium 4.6 3.5 - 5.3 mmol/L   Chloride 104 98 - 110 mmol/L   CO2 32 20 - 32 mmol/L   Calcium 9.6 8.6 - 10.4 mg/dL   Total Protein 6.5 6.1 - 8.1 g/dL   Albumin 4.2 3.6 - 5.1 g/dL   Globulin 2.3 1.9 - 3.7 g/dL (calc)   AG Ratio 1.8 1.0 - 2.5 (calc)   Total Bilirubin 0.6 0.2 - 1.2 mg/dL   Alkaline phosphatase (APISO) 61 37 - 153 U/L  AST 18 10 - 35 U/L   ALT 14 6 - 29 U/L  Lipid panel   Collection Time: 08/06/23  8:07 AM  Result Value Ref Range   Cholesterol 283 (H) <200 mg/dL   HDL 74 > OR = 50 mg/dL   Triglycerides 77 <161 mg/dL   LDL Cholesterol (Calc) 191 (H) mg/dL (calc)   Total CHOL/HDL Ratio 3.8 <5.0 (calc)   Non-HDL Cholesterol (Calc) 209 (H) <130 mg/dL (calc)  Hemoglobin W9U   Collection Time: 08/06/23  8:07 AM  Result Value Ref Range   Hgb A1c MFr Bld 5.1 <5.7 % of total Hgb   Mean Plasma Glucose 100 mg/dL   eAG (mmol/L) 5.5 mmol/L  Hepatitis C antibody   Collection Time: 08/06/23  8:07 AM  Result Value Ref Range   Hepatitis C Ab NON-REACTIVE NON-REACTIVE  HIV Antibody (routine testing w rflx)   Collection Time: 08/06/23  8:07 AM  Result Value Ref Range   HIV 1&2 Ab, 4th Generation NON-REACTIVE NON-REACTIVE  Vitamin B12   Collection Time: 08/06/23  8:07 AM  Result Value Ref Range   Vitamin B-12 167 (L) 200 - 1,100 pg/mL  TSH   Collection Time: 08/06/23  8:07 AM  Result Value Ref Range   TSH 2.65 0.40 - 4.50 mIU/L  TEST AUTHORIZATION   Collection  Time: 08/06/23  8:07 AM  Result Value Ref Range   TEST NAME: TSH    TEST CODE: 899XLL3    CLIENT CONTACT: LESLIE SMITH QUEST    REPORT ALWAYS MESSAGE SIGNATURE    Cologuard   Collection Time: 08/12/23 10:14 AM  Result Value Ref Range   COLOGUARD Negative Negative   {Labs (Optional):23779}       Assessment & Plan:   Problem List Items Addressed This Visit   None    Assessment and Plan Assessment & Plan         Follow up plan: No follow-ups on file.

## 2023-11-22 DIAGNOSIS — H43813 Vitreous degeneration, bilateral: Secondary | ICD-10-CM | POA: Diagnosis not present

## 2023-11-22 DIAGNOSIS — H2513 Age-related nuclear cataract, bilateral: Secondary | ICD-10-CM | POA: Diagnosis not present

## 2023-11-22 DIAGNOSIS — H33312 Horseshoe tear of retina without detachment, left eye: Secondary | ICD-10-CM | POA: Diagnosis not present

## 2023-12-07 DIAGNOSIS — M19041 Primary osteoarthritis, right hand: Secondary | ICD-10-CM | POA: Diagnosis not present

## 2023-12-07 DIAGNOSIS — M19042 Primary osteoarthritis, left hand: Secondary | ICD-10-CM | POA: Diagnosis not present

## 2023-12-07 DIAGNOSIS — S61451A Open bite of right hand, initial encounter: Secondary | ICD-10-CM | POA: Diagnosis not present

## 2023-12-07 DIAGNOSIS — W540XXA Bitten by dog, initial encounter: Secondary | ICD-10-CM | POA: Diagnosis not present

## 2023-12-07 DIAGNOSIS — S60472A Other superficial bite of right middle finger, initial encounter: Secondary | ICD-10-CM | POA: Diagnosis not present

## 2024-02-03 ENCOUNTER — Encounter: Payer: Self-pay | Admitting: Nurse Practitioner

## 2024-02-03 ENCOUNTER — Ambulatory Visit
Admission: RE | Admit: 2024-02-03 | Discharge: 2024-02-03 | Disposition: A | Discharge status: Home / Self Care | Source: Ambulatory Visit | Attending: Nurse Practitioner | Admitting: Nurse Practitioner

## 2024-02-03 ENCOUNTER — Ambulatory Visit: Payer: Medicaid Other | Admitting: Nurse Practitioner

## 2024-02-03 ENCOUNTER — Ambulatory Visit
Admission: RE | Admit: 2024-02-03 | Discharge: 2024-02-03 | Disposition: A | Discharge status: Home / Self Care | Attending: Nurse Practitioner | Admitting: Nurse Practitioner

## 2024-02-03 VITALS — BP 116/80 | HR 108 | Temp 97.6°F | Resp 18 | Ht 70.0 in | Wt 170.3 lb

## 2024-02-03 DIAGNOSIS — E785 Hyperlipidemia, unspecified: Secondary | ICD-10-CM | POA: Diagnosis not present

## 2024-02-03 DIAGNOSIS — M25561 Pain in right knee: Secondary | ICD-10-CM | POA: Diagnosis not present

## 2024-02-03 DIAGNOSIS — R0602 Shortness of breath: Secondary | ICD-10-CM

## 2024-02-03 DIAGNOSIS — E538 Deficiency of other specified B group vitamins: Secondary | ICD-10-CM | POA: Diagnosis not present

## 2024-02-03 DIAGNOSIS — M2390 Unspecified internal derangement of unspecified knee: Secondary | ICD-10-CM | POA: Diagnosis not present

## 2024-02-03 DIAGNOSIS — R251 Tremor, unspecified: Secondary | ICD-10-CM | POA: Diagnosis not present

## 2024-02-03 DIAGNOSIS — M1990 Unspecified osteoarthritis, unspecified site: Secondary | ICD-10-CM | POA: Diagnosis not present

## 2024-02-03 DIAGNOSIS — F5101 Primary insomnia: Secondary | ICD-10-CM | POA: Diagnosis not present

## 2024-02-03 DIAGNOSIS — R002 Palpitations: Secondary | ICD-10-CM

## 2024-02-03 DIAGNOSIS — M542 Cervicalgia: Secondary | ICD-10-CM

## 2024-02-03 DIAGNOSIS — Z130 Encounter for screening for diseases of the blood and blood-forming organs and certain disorders involving the immune mechanism: Secondary | ICD-10-CM | POA: Diagnosis not present

## 2024-02-03 DIAGNOSIS — M25562 Pain in left knee: Secondary | ICD-10-CM | POA: Diagnosis not present

## 2024-02-03 DIAGNOSIS — M797 Fibromyalgia: Secondary | ICD-10-CM | POA: Diagnosis not present

## 2024-02-03 DIAGNOSIS — Z13220 Encounter for screening for lipoid disorders: Secondary | ICD-10-CM | POA: Diagnosis not present

## 2024-02-03 LAB — CBC WITH DIFFERENTIAL/PLATELET
Absolute Lymphocytes: 2632 {cells}/uL (ref 850–3900)
Absolute Monocytes: 297 {cells}/uL (ref 200–950)
Basophils Absolute: 39 {cells}/uL (ref 0–200)
Basophils Relative: 0.7 %
Eosinophils Absolute: 230 {cells}/uL (ref 15–500)
Eosinophils Relative: 4.1 %
HCT: 38.8 % (ref 35.0–45.0)
Hemoglobin: 12.6 g/dL (ref 11.7–15.5)
MCH: 30.4 pg (ref 27.0–33.0)
MCHC: 32.5 g/dL (ref 32.0–36.0)
MCV: 93.5 fL (ref 80.0–100.0)
MPV: 11.6 fL (ref 7.5–12.5)
Monocytes Relative: 5.3 %
Neutro Abs: 2402 {cells}/uL (ref 1500–7800)
Neutrophils Relative %: 42.9 %
Platelets: 257 Thousand/uL (ref 140–400)
RBC: 4.15 Million/uL (ref 3.80–5.10)
RDW: 11.6 % (ref 11.0–15.0)
Total Lymphocyte: 47 %
WBC: 5.6 Thousand/uL (ref 3.8–10.8)

## 2024-02-03 LAB — COMPREHENSIVE METABOLIC PANEL WITH GFR
AG Ratio: 1.9 (calc) (ref 1.0–2.5)
ALT: 12 U/L (ref 6–29)
AST: 18 U/L (ref 10–35)
Albumin: 4.3 g/dL (ref 3.6–5.1)
Alkaline phosphatase (APISO): 59 U/L (ref 37–153)
BUN: 8 mg/dL (ref 7–25)
CO2: 31 mmol/L (ref 20–32)
Calcium: 9.8 mg/dL (ref 8.6–10.4)
Chloride: 104 mmol/L (ref 98–110)
Creat: 0.77 mg/dL (ref 0.50–1.05)
Globulin: 2.3 g/dL (ref 1.9–3.7)
Glucose, Bld: 90 mg/dL (ref 65–99)
Potassium: 4.9 mmol/L (ref 3.5–5.3)
Sodium: 140 mmol/L (ref 135–146)
Total Bilirubin: 0.6 mg/dL (ref 0.2–1.2)
Total Protein: 6.6 g/dL (ref 6.1–8.1)
eGFR: 88 mL/min/1.73m2 (ref >=60)

## 2024-02-03 LAB — LIPID PANEL
Cholesterol: 244 mg/dL — ABNORMAL HIGH (ref <200)
HDL: 65 mg/dL (ref >=50)
LDL Cholesterol (Calc): 161 mg/dL — ABNORMAL HIGH
Non-HDL Cholesterol (Calc): 179 mg/dL — ABNORMAL HIGH (ref <130)
Total CHOL/HDL Ratio: 3.8 (calc) (ref <5.0)
Triglycerides: 76 mg/dL (ref <150)

## 2024-02-03 LAB — VITAMIN B12: Vitamin B-12: 196 pg/mL — ABNORMAL LOW (ref 200–1100)

## 2024-02-03 MED ORDER — TRAMADOL HCL 50 MG PO TABS: 50.0000 mg | ORAL_TABLET | Freq: Three times a day (TID) | ORAL | 1 refills | Status: AC | PRN

## 2024-02-03 MED ORDER — ZOLPIDEM TARTRATE 10 MG PO TABS: 10.0000 mg | ORAL_TABLET | Freq: Every evening | ORAL | 1 refills | Status: DC | PRN

## 2024-02-03 MED ORDER — CYANOCOBALAMIN 1000 MCG/ML IJ SOLN: 1000.0000 ug | INTRAMUSCULAR | 3 refills | Status: AC

## 2024-02-03 MED ORDER — PROPRANOLOL HCL 10 MG PO TABS: 10.0000 mg | ORAL_TABLET | Freq: Three times a day (TID) | ORAL | 3 refills | Status: AC | PRN

## 2024-02-03 MED ORDER — CYCLOBENZAPRINE HCL 10 MG PO TABS: 10.0000 mg | ORAL_TABLET | Freq: Three times a day (TID) | ORAL | 1 refills | Status: AC | PRN

## 2024-02-03 NOTE — Assessment & Plan Note (Signed)
 Continue taking the flexeril  and tramadol .

## 2024-02-03 NOTE — Assessment & Plan Note (Signed)
 Checking B12 level today. B12 injections sent into pharmacy for patient to self-inject at home.

## 2024-02-03 NOTE — Assessment & Plan Note (Signed)
 Continue taking the Ambien . Talked about working  on a sleep schedule.

## 2024-02-03 NOTE — Assessment & Plan Note (Signed)
 Educated about the importance of physical activity and heat/cold therapy for relief.

## 2024-02-03 NOTE — Progress Notes (Signed)
 BP 116/80   Pulse (!) 108   Temp 97.6 F (36.4 C)   Resp 18   Ht 5' 10 (1.778 m)   Wt 170 lb 4.8 oz (77.2 kg)   SpO2 99%   BMI 24.44 kg/m    Subjective:    Patient ID: Miranda Petersen, female    DOB: 10-27-1960, 63 y.o.   MRN: [id number]  HPI: Miranda Petersen is a 63 y.o. female presenting today for a 6 month follow-up for chronic medical conditions   Fibromyalgia: -Taking tramadol  once daily  -Reports tramadol  is effective   Dupuytren's Contractures: -Bilateral hand contractures for 5-6 years -No changes in management   Knee locking: -Reports knee locking when standing and  lying down -Reports occurring more frequently (twice a month) -Reports it can take 20 min for it to unlock with massage. -Right knee worse than left, but reports both affected   Neck pain: -Reports left sided neck pain that comes and goes, especially after sleeping  -Reports heating pad helps  Shortness of breath: -Reports shortness of breath with exertion -Reports sedentary lifestyle -Denies any chest pain  -Will obtain EKG today   Insomnia  -Taking ambien  10mg  every night but reports will awake in the night and can't go back to sleep  -Patient reports she would like to continue on the Ambien  -Talked about working on a sleep schedule   Depression: -Reports not taking Paxil  at this time. -Pt reports she is happy now that she is not working.    Arthritis: -Reports not worsening at this time. Present in neck, back, knees, and fingers -Back pain managed with tramadol  as needed  -Uses heating pad at home   B12 deficiency: -Not currently taking a B12 injection  -Last injection was 4 months ago -Interested in giving injections to herself at home -Checking B12 today     Tremors: -Reports tremor is improving  -Taking propranolol   10mg  at home   General  Health Maintenance: -Completed cologuard on 08/12/23 (negative)  -HIV and Hep C testing completed on 08/06/23 -Mammogram completed on  10/27/23 -Routine labs completed today         02/03/2024    9:19 AM 08/06/2023    7:42 AM  Depression screen PHQ 2/9  Decreased Interest 0 0  Down, Depressed, Hopeless 0 0  PHQ - 2 Score 0 0  Altered sleeping 0 1  Tired, decreased energy 0 0  Change in appetite 0 0  Feeling bad or failure about yourself  0 0  Trouble concentrating 0 0  Moving slowly or fidgety/restless 0 0  Suicidal thoughts 0 0  PHQ-9 Score 0 1  Difficult doing work/chores Not difficult at all Not difficult at all    Relevant past medical, surgical, family and social history reviewed and updated as indicated. Interim medical history since our last visit reviewed. Allergies and medications reviewed and updated.  Review of Systems  Constitutional: Negative for fever or weight change.  Respiratory: Positive for shortness of breath.  Denies cough.  Cardiovascular: Negative for chest pain. Reports one episode of palpitations recently but resolved on its own.  Gastrointestinal: Negative for abdominal pain, no bowel changes.  Musculoskeletal: Positive for knee locking and left sided neck tenderness  Skin: Negative for rash.  Neurological: Reports occasional tremor of hands. Negative for dizziness or headache.  No other specific complaints in a complete review of systems (except as listed in HPI above).      Objective:  BP 116/80   Pulse (!) 108   Temp 97.6 F (36.4 C)   Resp 18   Ht 5' 10 (1.778 m)   Wt 170 lb 4.8 oz (77.2 kg)   SpO2 99%   BMI 24.44 kg/m    Wt Readings from Last 3 Encounters:  02/03/24 170 lb 4.8 oz (77.2 kg)  08/06/23 174 lb 4.8 oz (79.1 kg)  02/03/23 170 lb (77.1 kg)    Physical Exam Constitutional:      Appearance: Normal appearance.  HENT:     Head: Normocephalic and atraumatic.  Neck:     Comments: Left side of neck  Cardiovascular:     Rate and Rhythm: Normal rate and regular rhythm.     Pulses: Normal pulses.     Heart sounds: Normal heart sounds.  Pulmonary:      Effort: Pulmonary effort is normal.     Breath sounds: Normal breath sounds.  Musculoskeletal:        General: Normal range of motion.     Cervical back: Normal range of motion. Tenderness present.  Skin:    General: Skin is warm and dry.  Neurological:     General: No focal deficit present.     Mental Status: She is alert and oriented to person, place, and time.  Psychiatric:        Mood and Affect: Mood normal.        Behavior: Behavior normal.        Thought Content: Thought content normal.      Results for orders placed or performed in visit on 08/06/23  CBC with Differential/Platelet   Collection Time: 08/06/23  8:07 AM  Result Value Ref Range   WBC 5.4 3.8 - 10.8 Thousand/uL   RBC 4.10 3.80 - 5.10 Million/uL   Hemoglobin 12.8 11.7 - 15.5 g/dL   HCT 61.4 64.9 - 54.9 %   MCV 93.9 80.0 - 100.0 fL   MCH 31.2 27.0 - 33.0 pg   MCHC 33.2 32.0 - 36.0 g/dL   RDW 88.4 88.9 - 84.9 %   Platelets 270 140 - 400 Thousand/uL   MPV 10.5 7.5 - 12.5 fL   Neutro Abs 2,835 1,500 - 7,800 cells/uL   Absolute Lymphocytes 2,095 850 - 3,900 cells/uL   Absolute Monocytes 329 200 - 950 cells/uL   Eosinophils Absolute 119 15 - 500 cells/uL   Basophils Absolute 22 0 - 200 cells/uL   Neutrophils Relative % 52.5 %   Total Lymphocyte 38.8 %   Monocytes Relative 6.1 %   Eosinophils Relative 2.2 %   Basophils Relative 0.4 %  COMPLETE METABOLIC PANEL WITH GFR   Collection Time: 08/06/23  8:07 AM  Result Value Ref Range   Glucose, Bld 76 65 - 99 mg/dL   BUN 7 7 - 25 mg/dL   Creat 9.26 9.49 - 8.94 mg/dL   eGFR 94 > OR = 60 fO/fpw/8.26f7   BUN/Creatinine Ratio SEE NOTE: 6 - 22 (calc)   Sodium 142 135 - 146 mmol/L   Potassium 4.6 3.5 - 5.3 mmol/L   Chloride 104 98 - 110 mmol/L   CO2 32 20 - 32 mmol/L   Calcium 9.6 8.6 - 10.4 mg/dL   Total Protein 6.5 6.1 - 8.1 g/dL   Albumin 4.2 3.6 - 5.1 g/dL   Globulin 2.3 1.9 - 3.7 g/dL (calc)   AG Ratio 1.8 1.0 - 2.5 (calc)   Total Bilirubin 0.6 0.2  - 1.2 mg/dL   Alkaline phosphatase (  APISO) 61 37 - 153 U/L   AST 18 10 - 35 U/L   ALT 14 6 - 29 U/L  Lipid panel   Collection Time: 08/06/23  8:07 AM  Result Value Ref Range   Cholesterol 283 (H) <200 mg/dL   HDL 74 > OR = 50 mg/dL   Triglycerides 77 <849 mg/dL   LDL Cholesterol (Calc) 191 (H) mg/dL (calc)   Total CHOL/HDL Ratio 3.8 <5.0 (calc)   Non-HDL Cholesterol (Calc) 209 (H) <130 mg/dL (calc)  Hemoglobin J8r   Collection Time: 08/06/23  8:07 AM  Result Value Ref Range   Hgb A1c MFr Bld 5.1 <5.7 % of total Hgb   Mean Plasma Glucose 100 mg/dL   eAG (mmol/L) 5.5 mmol/L  Hepatitis C antibody   Collection Time: 08/06/23  8:07 AM  Result Value Ref Range   Hepatitis C Ab NON-REACTIVE NON-REACTIVE  HIV Antibody (routine testing w rflx)   Collection Time: 08/06/23  8:07 AM  Result Value Ref Range   HIV 1&2 Ab, 4th Generation NON-REACTIVE NON-REACTIVE  Vitamin B12   Collection Time: 08/06/23  8:07 AM  Result Value Ref Range   Vitamin B-12 167 (L) 200 - 1,100 pg/mL  TSH   Collection Time: 08/06/23  8:07 AM  Result Value Ref Range   TSH 2.65 0.40 - 4.50 mIU/L  TEST AUTHORIZATION   Collection Time: 08/06/23  8:07 AM  Result Value Ref Range   TEST NAME: TSH    TEST CODE: 899XLL3    CLIENT CONTACT: LESLIE SMITH QUEST    REPORT ALWAYS MESSAGE SIGNATURE    Cologuard   Collection Time: 08/12/23 10:14 AM  Result Value Ref Range   COLOGUARD Negative Negative          Assessment & Plan:   Problem List Items Addressed This Visit       Musculoskeletal and Integument   Arthritis   Educated about the importance of physical activity and heat/cold therapy for relief.       Relevant Medications   traMADol  (ULTRAM ) 50 MG tablet   cyclobenzaprine  (FLEXERIL ) 10 MG tablet     Other   Fibromyalgia - Primary   Continue taking the flexeril  and tramadol .       Relevant Medications   traMADol  (ULTRAM ) 50 MG tablet   cyclobenzaprine  (FLEXERIL ) 10 MG tablet   Insomnia    Continue taking the Ambien . Talked about working  on a sleep schedule.      Relevant Medications   zolpidem  (AMBIEN ) 10 MG tablet   Tremor   Patient reports well controlled. Continue taking the propranolol .      Vitamin B 12 deficiency   Checking B12 level today. B12 injections sent into pharmacy for patient to self-inject at home.       Relevant Medications   cyanocobalamin  (VITAMIN B12) 1000 MCG/ML injection   Other Relevant Orders   Vitamin B12   Knee locking   Sent for imaging. Plan to refer to ortho after obtaining image results.       Relevant Orders   DG Knee 1-2 Views Left   DG Knee 1-2 Views Right   Other Visit Diagnoses       Neck pain       Continue with heat therapy     Shortness of breath       EKG performed in office and ambulatory pulse ox. Unremarkable.   Relevant Orders   EKG 12-Lead     Screening for cholesterol level  Checking lipid panel today   Relevant Orders   Lipid panel     Hyperlipidemia, unspecified hyperlipidemia type       Obtaining lipid level today   Relevant Medications   propranolol  (INDERAL ) 10 MG tablet   Other Relevant Orders   Lipid panel   Comprehensive metabolic panel with GFR     Screening for deficiency anemia       Obtaining CBC today   Relevant Orders   CBC with Differential/Platelet     Palpitations       Continue taking the propranolol    Relevant Medications   propranolol  (INDERAL ) 10 MG tablet        Assessment and Plan: -Refills sent to pharmacy -Will notify patient of knee imaging results -Will notify patient of lab results -EKG showed normal sinus rhythm  -Ambulatory pusle ox was unremarkable. (96%-98%)          Follow up plan: Return in about 6 months (around 08/05/2024) for follow up.   I have reviewed this encounter including the documentation in this note and/or discussed this patient with the provider, Aislinn Womack, SNP, I am certifying that I agree with the content of this note as  supervising/preceptor nurse practitioner.  Mliss Spray, FNP-C [facility] 02/03/2024, 11:17 AM

## 2024-02-03 NOTE — Assessment & Plan Note (Signed)
 Sent for imaging. Plan to refer to ortho after obtaining image results.

## 2024-02-03 NOTE — Assessment & Plan Note (Signed)
 Patient reports well controlled. Continue taking the propranolol .

## 2024-02-04 ENCOUNTER — Ambulatory Visit: Payer: Self-pay | Admitting: Nurse Practitioner

## 2024-02-04 DIAGNOSIS — M2390 Unspecified internal derangement of unspecified knee: Secondary | ICD-10-CM

## 2024-04-17 ENCOUNTER — Telehealth: Payer: Self-pay

## 2024-04-17 DIAGNOSIS — M797 Fibromyalgia: Secondary | ICD-10-CM

## 2024-04-17 NOTE — Progress Notes (Signed)
 Complex Care Management Note Care Guide Note  04/17/2024 Name: Miranda Petersen MRN: [id number] DOB: [birth year]   Complex Care Management Outreach Attempts: An unsuccessful telephone outreach was attempted today to offer the patient information about available complex care management services.  Follow Up Plan:  Additional outreach attempts will be made to offer the patient complex care management information and services.   Encounter Outcome:  No Answer  Dreama [facility]  [facility], [facility] VBCI Assistant Direct Dial: [phone]  Fax: [phone]

## 2024-04-21 NOTE — Progress Notes (Signed)
 Complex Care Management Note Care Guide Note  04/21/2024 Name: Miranda Petersen MRN: [id number] DOB: 25-Oct-1960   Complex Care Management Outreach Attempts: A second unsuccessful outreach was attempted today to offer the patient with information about available complex care management services.  Follow Up Plan:  Additional outreach attempts will be made to offer the patient complex care management information and services.   Encounter Outcome:  No Answer  Dreama [facility]  [facility], [facility] VBCI Assistant Direct Dial: [phone]  Fax: [phone]

## 2024-04-24 NOTE — Progress Notes (Signed)
 Complex Care Management Note Care Guide Note  04/24/2024 Name: Miranda Petersen MRN: [id number] DOB: Feb 18, 1961   Complex Care Management Outreach Attempts: A third unsuccessful outreach was attempted today to offer the patient with information about available complex care management services.  Follow Up Plan:  No further outreach attempts will be made at this time. We have been unable to contact the patient to offer or enroll patient in complex care management services.  Encounter Outcome:  No Answer  Dreama [facility]  [facility], [facility] VBCI Assistant Direct Dial: [phone]  Fax: [phone]

## 2024-04-25 ENCOUNTER — Ambulatory Visit
Admission: RE | Admit: 2024-04-25 | Discharge: 2024-04-25 | Disposition: A | Discharge status: Home / Self Care | Source: Ambulatory Visit | Attending: Emergency Medicine | Admitting: Emergency Medicine

## 2024-04-25 VITALS — BP 137/86 | HR 103 | Temp 99.3°F | Resp 20

## 2024-04-25 DIAGNOSIS — K122 Cellulitis and abscess of mouth: Secondary | ICD-10-CM

## 2024-04-25 MED ORDER — AMOXICILLIN-POT CLAVULANATE 875-125 MG PO TABS: 1.0000 | ORAL_TABLET | Freq: Two times a day (BID) | ORAL | 0 refills | Status: DC

## 2024-04-25 NOTE — Progress Notes (Signed)
 Complex Care Management Note  Care Guide Note 04/25/2024 Name: Miranda Petersen MRN: [id number] DOB: [birth year]  Miranda Petersen is a 63 y.o. year old female who sees Gareth Mliss FALCON, FNP for primary care. I reached out to Miranda Petersen by phone today to offer complex care management services.  Miranda Petersen was given information about Complex Care Management services today including:   The Complex Care Management services include support from the care team which includes your Nurse Care Manager, Clinical Social Worker, or Pharmacist.  The Complex Care Management team is here to help remove barriers to the health concerns and goals most important to you. Complex Care Management services are voluntary, and the patient may decline or stop services at any time by request to their care team member.   Complex Care Management Consent Status: Patient agreed to services and verbal consent obtained.   Follow up plan:  Telephone appointment with complex care management team member scheduled for:  05/05/24 at 2:00 p.m.   Encounter Outcome:  Patient Scheduled  Dreama [facility]  [facility], [facility] VBCI Assistant Direct Dial: [phone]  Fax: [phone]

## 2024-04-25 NOTE — Discharge Instructions (Signed)
 Today you are evaluated for the pain and swelling to your cheek which I do believe is an abscess, and abscess is a pocket of infection and therefore you will be started on antibiotics  Take Augmentin  twice daily for 7 days for treatment, symptoms should progressively improve after you take the medicine  You may continue use of ibuprofen Tylenol or tramadol  as needed for pain management  Until able to tolerate foods as normal Ensure that you increase your fluid intake to maintain your hydration  May follow-up with urgent care as needed for any concerns, would recommend also following up with dentist

## 2024-04-25 NOTE — ED Triage Notes (Signed)
 Patient reports abscess to right upper side of mouth x 4 days. Patient has taken Ibuprofen with mild relief and tramadol  with no relief.  Rates pain 3/10.

## 2024-04-25 NOTE — ED Provider Notes (Signed)
 Miranda Petersen    CSN: [id number] Arrival date & time: 04/25/24  9147      History   Chief Complaint Chief Complaint  Patient presents with   Abscess    Mouth teeth. - Entered by patient    HPI Miranda Petersen is a 63 y.o. female.   Patient presents for evaluation of of soreness and swelling present to the right cheek beginning 4 days ago.  Initially began as pain to the gumline of the top center teeth in the right lower teeth, progressively worsening.  Has become painful to chew, tolerable to soft foods and liquids.  Has felt feverish described as a hot flash.  Has attempted ibuprofen and tramadol .  No need for dental work.   Past Medical History:  Diagnosis Date   Allergy    Arthritis    Contracture of hand    Depression    Fibromyalgia    GERD (gastroesophageal reflux disease)    Insomnia    Nonspecific abnormal results of cardiovascular function study 07/09/2010   Subsequent cardiac catheterization demonstrating normal coronary arteries with exception of myocardial LAD bridging   Occasional tremors    Syndactyly    Status post surgical repair    Patient Active Problem List   Diagnosis Date Noted   Knee locking 02/03/2024   Dupuytren's contracture of both hands 08/06/2023   Mild episode of recurrent major depressive disorder 08/06/2023   Tremor 08/06/2023   Bilateral carpal tunnel syndrome 08/06/2023   Vitamin B 12 deficiency 08/06/2023   Fibromyalgia    Insomnia    Arthritis    Contracture of hand    CHEST PAIN 07/30/2010   Nonspecific abnormal results of cardiovascular function study 07/30/2010    Past Surgical History:  Procedure Laterality Date   DILATION AND CURETTAGE OF UTERUS     LUMBAR LAMINECTOMY/DECOMPRESSION MICRODISCECTOMY  12/2019   REPAIR OF SYNDACTYLY HAND     SPINE SURGERY     TONSILLECTOMY     TUBAL LIGATION      OB History   No obstetric history on file.      Home Medications    Prior to Admission medications    Medication Sig Start Date End Date Taking? Authorizing Provider  cyanocobalamin  (VITAMIN B12) 1000 MCG/ML injection Inject 1 mL (1,000 mcg total) into the muscle every 30 (thirty) days. 02/03/24   Gareth Mliss FALCON, FNP  cyclobenzaprine  (FLEXERIL ) 10 MG tablet Take 10 mg by mouth at bedtime. 10/05/17   [provider]  cyclobenzaprine  (FLEXERIL ) 10 MG tablet Take 1 tablet (10 mg total) by mouth 3 (three) times daily as needed for muscle spasms. 02/03/24   Gareth Mliss FALCON, FNP  PARoxetine  (PAXIL ) 20 MG tablet Take 1 tablet (20 mg total) by mouth at bedtime. 06/26/19   Athar, Saima, MD  propranolol  (INDERAL ) 10 MG tablet Take 1 tablet (10 mg total) by mouth 3 (three) times daily as needed. 02/03/24   Gareth Mliss FALCON, FNP  traMADol  (ULTRAM ) 50 MG tablet Take 50 mg by mouth every 8 (eight) hours as needed. Maximum dose= 8 tablets per day    [provider]  traMADol  (ULTRAM ) 50 MG tablet Take 1 tablet (50 mg total) by mouth every 8 (eight) hours as needed. 02/03/24   Pender, Julie F, FNP  zolpidem  (AMBIEN ) 10 MG tablet Take 1 tablet (10 mg total) by mouth at bedtime as needed for sleep. 11/08/23   Gareth Mliss FALCON, FNP  zolpidem  (AMBIEN ) 10 MG tablet Take 1  tablet (10 mg total) by mouth at bedtime as needed for sleep. 02/03/24 03/04/24  Gareth Mliss FALCON, FNP    Family History Family History  Problem Relation Age of Onset   Breast cancer Mother 31   Hypertension Mother    Atrial fibrillation Mother    Arthritis Mother    Cancer Mother    Heart disease Mother    Kidney disease Mother    Hypertension Father    Birth defects Father    Diabetes Father    Valvular heart disease Sister    Hypertension Brother    Alcohol  abuse Brother    Birth defects Brother    Drug abuse Brother    Stroke Paternal Grandmother     Social History Social History   Tobacco Use   Smoking status: Never   Smokeless tobacco: Never  Vaping Use   Vaping status: Never Used  Substance Use Topics   Alcohol   use: Not Currently    Comment: Socially   Drug use: No     Allergies   Codeine   Review of Systems Review of Systems   Physical Exam Triage Vital Signs ED Triage Vitals  Encounter Vitals Group     BP 04/25/24 0908 137/86     Girls Systolic BP Percentile --      Girls Diastolic BP Percentile --      Boys Systolic BP Percentile --      Boys Diastolic BP Percentile --      Pulse Rate 04/25/24 0908 (!) 103     Resp 04/25/24 0908 20     Temp 04/25/24 0908 99.3 F (37.4 C)     Temp Source 04/25/24 0908 Oral     SpO2 04/25/24 0908 97 %     Weight --      Height --      Head Circumference --      Peak Flow --      Pain Score 04/25/24 0906 3     Pain Loc --      Pain Education --      Exclude from Growth Chart --    No data found.  Updated Vital Signs BP 137/86 (BP Location: Left Arm)   Pulse (!) 103   Temp 99.3 F (37.4 C) (Oral)   Resp 20   SpO2 97%   Visual Acuity Right Eye Distance:   Left Eye Distance:   Bilateral Distance:    Right Eye Near:   Left Eye Near:    Bilateral Near:     Physical Exam Constitutional:      Appearance: Normal appearance.  HENT:     Head:     Comments: Lower right cheek swelling and tenderness, skin hot to touch    Mouth/Throat:     Comments: Dental decay present within the oropharynx, no abscess noted to the gum blood, no gingival swelling, pharynx clear without obstruction Eyes:     Extraocular Movements: Extraocular movements intact.  Pulmonary:     Effort: Pulmonary effort is normal.  Neurological:     Mental Status: She is alert and oriented to person, place, and time.      UC Treatments / Results  Labs (all labs ordered are listed, but only abnormal results are displayed) Labs Reviewed - No data to display  EKG   Radiology No results found.  Procedures Procedures (including critical care time)  Medications Ordered in UC Medications - No data to display  Initial Impression / Assessment and Plan /  UC  Course  I have reviewed the triage vital signs and the nursing notes.  Pertinent labs & imaging results that were available during my care of the patient were reviewed by me and considered in my medical decision making (see chart for details).  Abscess of the right internal cheek  Presentation concerning for infection most likely related to the teeth, discussed this with patient presentation not consistent with a sinusitis or gland inflammation, discussed, prescribed Augmentin  recommend increase fluid intake with use of over-the-counter medications for pain management, advised follow-up with urgent care and dentist for any further concerns Final Clinical Impressions(s) / UC Diagnoses   Final diagnoses:  None   Discharge Instructions   None    ED Prescriptions   None    PDMP not reviewed this encounter.   Teresa Shelba SAUNDERS, [place] 04/25/24 [phone]

## 2024-04-28 ENCOUNTER — Encounter: Payer: Self-pay | Admitting: Nurse Practitioner

## 2024-05-01 MED ORDER — ZOLPIDEM TARTRATE 10 MG PO TABS: 10.0000 mg | ORAL_TABLET | Freq: Every evening | ORAL | 1 refills | Status: AC | PRN

## 2024-05-05 ENCOUNTER — Telehealth: Payer: Self-pay

## 2024-05-12 ENCOUNTER — Telehealth: Payer: Self-pay

## 2024-05-12 NOTE — Progress Notes (Unsigned)
 Complex Care Management Note Care Guide Note  05/12/2024 Name: Miranda Petersen MRN: [id number] DOB: 05-20-1961   Complex Care Management Outreach Attempts: An unsuccessful telephone outreach was attempted today to offer the patient information about available complex care management services.  Follow Up Plan:  Additional outreach attempts will be made to offer the patient complex care management information and services.   Encounter Outcome:  No Answer  Dreama [facility]  [facility], [facility] VBCI Assistant Direct Dial: [phone]  Fax: [phone]

## 2024-05-16 NOTE — Progress Notes (Signed)
 Complex Care Management Care Guide Note  05/16/2024 Name: Miranda Petersen MRN: [id number] DOB: [birth year]  Miranda Petersen is a 63 y.o. year old female who is a primary care patient of Gareth Mliss FALCON, FNP and is actively engaged with the care management team. I reached out to Devere ONEIDA Hope by phone today to assist with re-scheduling  with the RN Case Manager.  Follow up plan: Telephone appointment with complex care management team member scheduled for:  05/29/24 at 11:00 a.m.   Dreama [facility]  [facility], [facility] VBCI Assistant Direct Dial: [phone]  Fax: [phone]

## 2024-05-29 ENCOUNTER — Telehealth

## 2024-05-29 DIAGNOSIS — Z558 Other problems related to education and literacy: Secondary | ICD-10-CM

## 2024-05-29 DIAGNOSIS — F32A Depression, unspecified: Secondary | ICD-10-CM

## 2024-05-29 DIAGNOSIS — F411 Generalized anxiety disorder: Secondary | ICD-10-CM
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
## 2024-05-29 NOTE — Patient Instructions (Signed)
 Visit Information  Miranda Petersen was given information about Medicaid Managed Care team care coordination services as a part of their [place] [employer] benefit.   If you would like to schedule transportation through your [place] plan, please call the following number at least 2 days in advance of your appointment: [phone].   There is no limit to the number of trips during the year between medical appointments, healthcare facilities, or pharmacies. Transportation must be scheduled at least 2 business days before but not more than thirty 30 days before of your appointment.  Call the Behavioral Health Crisis Line at [phone], at any time, 24 hours a day, 7 days a week. If you are in danger or need immediate medical attention call 911.   Please see education materials related to Fall Precautions, Fibromyalgia/Osteoarthritis Pain, Depression, Anxiety, provided as print materials.  The patient verbalized understanding of instructions, educational materials, and care plan provided today and agreed to receive a mailed copy of patient instructions, educational materials, and care plan.   Telephone follow up appointment with Managed Medicaid care management team member scheduled for: 06/16/2024 at 2:00 pm  Nestora Duos, MSN, RN [place]  [facility], [facility] RN Care Manager Direct Dial: [phone] Fax: [phone]  Following is a copy of your plan of care:   Goals Addressed             This Visit's Progress    [facility] RN Care Plan- OA Fibromyalgia Falls       Problems:  Chronic Disease Management support and education needs related to Osteoarthritis and Fibromyalgia, Falls  Goal: Over the next 90 days the Patient will attend all scheduled medical appointments: Patient will attend appointments as evidenced by chart review        continue to work with RN Care Manager and/or Social Worker to address care management and care  coordination needs related to Osteoarthritis and Fibromyalgia and Falls as evidenced by adherence to care management team scheduled appointments     take all medications exactly as prescribed and will call provider for medication related questions as evidenced by patient report    verbalize basic understanding of Osteoarthritis and Fibromyalgia and falls disease process and self health management plan as evidenced by taking medications as prescribed, promptly reporting changes in symptoms, falls, concerns to provider, lifestyle modifications including increasing water intake, reducing caffeine and alcohol , heart healthy low sodium diet, exercise as tolerated, consider use of cane as needed with OA/Fibromyalgia pain flare work with child psychotherapist to address Limited education about Medicare/Disability - BSW* and , LCSW  related to the management of Anxiety, Depression, and related to medical condition as evidenced by review of electronic medical record and patient or social worker report     work with PCP to discuss memory and anxiety/depression as evidenced by review of electronic medical record and patient or care team member report    Interventions:   Falls Interventions: Provided written and verbal education re: potential causes of falls and Fall prevention strategies Reviewed medications and discussed potential side effects of medications such as dizziness and frequent urination Advised patient of importance of notifying provider of falls Assessed for signs and symptoms of orthostatic hypotension Assessed for falls since last encounter Assessed patients knowledge of fall risk prevention secondary to previously provided education Screening for signs and symptoms of depression related to chronic disease state  Assessed social determinant of health barriers   Pain Interventions: Pain assessment performed Medications reviewed Reviewed provider  established plan for pain management Discussed  importance of adherence to all scheduled medical appointments Counseled on the importance of reporting any/all new or changed pain symptoms or management strategies to pain management provider Advised patient to report to care team affect of pain on daily activities Discussed use of relaxation techniques and/or diversional activities to assist with pain reduction (distraction, imagery, relaxation, massage, acupressure, TENS, heat, and cold application Reviewed with patient prescribed pharmacological and nonpharmacological pain relief strategies  Patient Self-Care Activities:  Attend all scheduled provider appointments Call pharmacy for medication refills 3-7 days in advance of running out of medications Call provider office for new concerns or questions  Take medications as prescribed   Work with the social worker to address care coordination needs and will continue to work with the clinical team to address health care and disease management related needs  Plan:  Telephone follow up appointment with care management team member scheduled for:  06/16/2024 at 2:00 pm              Myofascial Pain Syndrome and Fibromyalgia Myofascial pain syndrome and fibromyalgia are both pain disorders. You may feel this pain mainly in your muscles. Myofascial pain syndrome: Always has tender points in the muscles that will cause pain when pressed (trigger points). The pain may come and go. Usually affects your neck, upper back, and shoulder areas. The pain often moves into your arms and hands. Fibromyalgia: Has muscle pains and tenderness that come and go. Is often associated with tiredness (fatigue) and sleep problems. Has trigger points. Tends to be long-lasting (chronic), but is not life-threatening. Fibromyalgia and myofascial pain syndrome are not the same. However, they often occur together. If you have both conditions, each can make the other worse. Both are common and can cause enough pain and  fatigue to make day-to-day activities difficult. Both can be hard to diagnose because their symptoms are common in many other conditions. What are the causes? The exact causes of these conditions are not known. What increases the risk? You are more likely to develop either of these conditions if: You have a family history of the condition. You are female. You have certain triggers, such as: Spine disorders. An injury (trauma) or other physical stressors. Being under a lot of stress. Medical conditions such as osteoarthritis, rheumatoid arthritis, or lupus. What are the signs or symptoms? Fibromyalgia The main symptom of fibromyalgia is widespread pain and tenderness in your muscles. Pain is sometimes described as stabbing, shooting, or burning. You may also have: Tingling or numbness. Sleep problems and fatigue. Problems with attention and concentration (fibro fog). Other symptoms may include: Bowel and bladder problems. Headaches. Vision problems. Sensitivity to odors and noises. Depression or mood changes. Painful menstrual periods (dysmenorrhea). Dry skin or eyes. These symptoms can vary over time. Myofascial pain syndrome Symptoms of myofascial pain syndrome include: Tight, ropy bands of muscle. Uncomfortable sensations in muscle areas. These may include aching, cramping, burning, numbness, tingling, and weakness. Difficulty moving certain parts of the body freely (poor range of motion). How is this diagnosed? This condition may be diagnosed by your symptoms and medical history. You will also have a physical exam. In general: Fibromyalgia is diagnosed if you have pain, fatigue, and other symptoms for more than 3 months, and symptoms cannot be explained by another condition. Myofascial pain syndrome is diagnosed if you have trigger points in your muscles, and those trigger points are tender and cause pain elsewhere in your body (referred pain). How is  this treated? Treatment  for these conditions depends on the type that you have. For fibromyalgia, a healthy lifestyle is the most important treatment including aerobic and strength exercises. Different types of medicines are used to help treat pain and include: NSAIDs. Medicines for treating depression. Medicines that help control seizures. Medicines that relax the muscles. Treatment for myofascial pain syndrome includes: Pain medicines, such as NSAIDs. Cooling and stretching of muscles. Massage therapy with myofascial release technique. Trigger point injections. Treating these conditions often requires a team of health care providers. These may include: Your primary care provider. A physical therapist. Complementary health care providers, such as massage therapists or acupuncturists. A psychiatrist for cognitive behavioral therapy. Follow these instructions at home: Medicines Take over-the-counter and prescription medicines only as told by your health care provider. Ask your health care provider if the medicine prescribed to you: Requires you to avoid driving or using machinery. Can cause constipation. You may need to take these actions to prevent or treat constipation: Drink enough fluid to keep your urine pale yellow. Take over-the-counter or prescription medicines. Eat foods that are high in fiber, such as beans, whole grains, and fresh fruits and vegetables. Limit foods that are high in fat and processed sugars, such as fried or sweet foods. Lifestyle  Do exercises as told by your health care provider or physical therapist. Practice relaxation techniques to control your stress. You may want to try: Biofeedback. Visual imagery. Hypnosis. Muscle relaxation. Yoga. Meditation. Maintain a healthy lifestyle. This includes eating a healthy diet and getting enough sleep. Do not use any products that contain nicotine or tobacco. These products include cigarettes, chewing tobacco, and vaping devices, such as  e-cigarettes. If you need help quitting, ask your health care provider. General instructions Talk to your health care provider about complementary treatments, such as acupuncture or massage. Do not do activities that stress or strain your muscles. This includes repetitive motions and heavy lifting. Keep all follow-up visits. This is important. Where to find support Consider joining a support group with others who are diagnosed with this condition. National Fibromyalgia Association: fmaware.org Where to find more information U.S. Pain Foundation: uspainfoundation.org Contact a health care provider if: You have new symptoms. Your symptoms get worse or your pain is severe. You have side effects from your medicines. You have trouble sleeping. Your condition is causing depression or anxiety. Get help right away if: You have thoughts of hurting yourself or others. Get help right away if you feel like you may hurt yourself or others, or have thoughts about taking your own life. Go to your nearest emergency room or: Call 911. Call the National Suicide Prevention Lifeline at (432) 789-6385 or 988. This is open 24 hours a day. Text the Crisis Text Line at 202-760-4544. This information is not intended to replace advice given to you by your health care provider. Make sure you discuss any questions you have with your health care provider. Document Revised: 03/02/2022 Document Reviewed: 04/25/2021 Elsevier Patient Education  2024 Elsevier Inc.  Osteoarthritis  Osteoarthritis is a type of arthritis. It refers to joint pain or joint disease. Osteoarthritis affects tissue that covers the ends of bones in joints (cartilage). Cartilage acts as a cushion between the bones and helps them move smoothly. Osteoarthritis occurs when cartilage in the joints gets worn down. Osteoarthritis is sometimes called wear and tear arthritis. Osteoarthritis is the most common form of arthritis. It often occurs in older  people. It is a condition that gets worse over time.  The joints most often affected by this condition are in the fingers, toes, hips, knees, and spine, including the neck and lower back. What are the causes? This condition is caused by the wearing down of cartilage that covers the ends of bones. What increases the risk? The following factors may make you more likely to develop this condition: Being age 68 or older. Obesity. Overuse of joints. Past injury of a joint. Past surgery on a joint. Family history of osteoarthritis. What are the signs or symptoms? The main symptoms of this condition are pain, swelling, and stiffness in the joint. Other symptoms may include: An enlarged joint. More pain and further damage caused by small pieces of bone or cartilage that break off and float inside of the joint. Small deposits of bone (osteophytes) that grow on the edges of the joint. A grating or scraping feeling inside the joint when you move it. Popping or creaking sounds when you move. Difficulty walking or exercising. An inability to grip items, twist your hand, or control the movements of your hands and fingers. How is this diagnosed? This condition may be diagnosed based on: Your medical history. A physical exam. Your symptoms. X-rays of the affected joints. Blood tests to rule out other types of arthritis. How is this treated? There is no cure for this condition, but treatment can help control pain and improve joint function. Treatment may include a combination of therapies, such as: Pain relief techniques, such as: Applying heat and cold to the joint. Massage. A form of talk therapy called cognitive behavioral therapy (CBT). This therapy helps you set goals and follow up on the changes that you make. Medicines for pain and inflammation. The medicines can be taken by mouth or applied to the skin. They include: NSAIDs, such as ibuprofen. Prescription medicines. Strong anti-inflammatory  medicines (corticosteroids). Certain nutritional supplements. A prescribed exercise program. You may work with a physical therapist. Assistive devices, such as a brace, wrap, splint, specialized glove, or cane. A weight control plan. Surgery, such as: An osteotomy. This is done to reposition the bones and relieve pain or to remove loose pieces of bone and cartilage. Joint replacement surgery. You may need this surgery if you have advanced osteoarthritis. Follow these instructions at home: Activity Rest your affected joints as told by your health care provider. Exercise as told by your provider. The provider may recommend specific types of exercise, such as: Strengthening exercises. These are done to strengthen the muscles that support joints affected by arthritis. Aerobic activities. These are exercises, such as brisk walking or water aerobics, that increase your heart rate. Range-of-motion activities. These help your joints move more easily. Balance and agility exercises. Managing pain, stiffness, and swelling     If told, apply heat to the affected area as often as told by your provider. Use the heat source that your provider recommends, such as a moist heat pack or a heating pad. If you have a removable assistive device, remove it as told by your provider. Place a towel between your skin and the heat source. If your provider tells you to keep the assistive device on while you apply heat, place a towel between the assistive device and the heat source. Leave the heat on for 20-30 minutes. If told, put ice on the affected area. If you have a removable assistive device, remove it as told by your provider. Put ice in a plastic bag. Place a towel between your skin and the bag. If your provider tells  you to keep the assistive device on during icing, place a towel between the assistive device and the bag. Leave the ice on for 20 minutes, 2-3 times a day. If your skin turns bright red, remove  the ice or heat right away to prevent skin damage. The risk of damage is higher if you cannot feel pain, heat, or cold. Move your fingers or toes often to reduce stiffness and swelling. Raise (elevate) the affected area above the level of your heart while you are sitting or lying down. General instructions Take over-the-counter and prescription medicines only as told by your provider. Maintain a healthy weight. Follow instructions from your provider for weight control. Do not use any products that contain nicotine or tobacco. These products include cigarettes, chewing tobacco, and vaping devices, such as e-cigarettes. If you need help quitting, ask your provider. Use assistive devices as told by your provider. Where to find more information [employer] of Arthritis and Musculoskeletal and Skin Diseases: niams.http://www.myers.net/ [employer] on Aging: baseringtones.pl American College of Rheumatology: rheumatology.org Contact a health care provider if: You have redness, swelling, or a feeling of warmth in a joint that gets worse. You have a fever along with joint or muscle aches. You develop a rash. You have trouble doing your normal activities. You have pain that gets worse and is not relieved by pain medicine. This information is not intended to replace advice given to you by your health care provider. Make sure you discuss any questions you have with your health care provider. Document Revised: 01/22/2022 Document Reviewed: 01/22/2022 Elsevier Patient Education  2024 Elsevier Inc.  Managing Anxiety, Adult After being diagnosed with anxiety, you may be relieved to know why you have felt or behaved a certain way. You may also feel overwhelmed about the treatment ahead and what it will mean for your life. With care and support, you can manage your anxiety. How to manage lifestyle changes Understanding the difference between stress and anxiety Although stress can play a role in anxiety, it is  not the same as anxiety. Stress is your body's reaction to life changes and events, both good and bad. Stress is often caused by something external, such as a deadline, test, or competition. It normally goes away after the event has ended and will last just a few hours. But, stress can be ongoing and can lead to more than just stress. Anxiety is caused by something internal, such as imagining a terrible outcome or worrying that something will go wrong that will greatly upset you. Anxiety often does not go away even after the event is over, and it can become a long-term (chronic) worry. Lowering stress and anxiety Talk with your health care provider or a counselor to learn more about lowering anxiety and stress. They may suggest tension-reduction techniques, such as: Music. Spend time creating or listening to music that you enjoy and that inspires you. Mindfulness-based meditation. Practice being aware of your normal breaths while not trying to control your breathing. It can be done while sitting or walking. Centering prayer. Focus on a word, phrase, or sacred image that means something to you and brings you peace. Deep breathing. Expand your stomach and inhale slowly through your nose. Hold your breath for 3-5 seconds. Then breathe out slowly, letting your stomach muscles relax. Self-talk. Learn to notice and spot thought patterns that lead to anxiety reactions. Change those patterns to thoughts that feel peaceful. Muscle relaxation. Take time to tense muscles and then relax them. Choose  a tension-reduction technique that fits your lifestyle and personality. These techniques take time and practice. Set aside 5-15 minutes a day to do them. Specialized therapists can offer counseling and training in these techniques. The training to help with anxiety may be covered by some insurance plans. Other things you can do to manage stress and anxiety include: Keeping a stress diary. This can help you learn what  triggers your reaction and then learn ways to manage your response. Thinking about how you react to certain situations. You may not be able to control everything, but you can control your response. Making time for activities that help you relax and not feeling guilty about spending your time in this way. Doing visual imagery. This involves imagining or creating mental pictures to help you relax. Practicing yoga. Through yoga poses, you can lower tension and relax.  Medicines Medicines for anxiety include: Antidepressant medicines. These are usually prescribed for long-term daily control. Anti-anxiety medicines. These may be added in severe cases, especially when panic attacks occur. When used together, medicines, psychotherapy, and tension-reduction techniques may be the most effective treatment. Relationships Relationships can play a big part in helping you recover. Spend more time connecting with trusted friends and family members. Think about going to couples counseling if you have a partner, taking family education classes, or going to family therapy. Therapy can help you and others better understand your anxiety. How to recognize changes in your anxiety Everyone responds differently to treatment for anxiety. Recovery from anxiety happens when symptoms lessen and stop interfering with your daily life at home or work. This may mean that you will start to: Have better concentration and focus. Worry will interfere less in your daily thinking. Sleep better. Be less irritable. Have more energy. Have improved memory. Try to recognize when your condition is getting worse. Contact your provider if your symptoms interfere with home or work and you feel like your condition is not improving. Follow these instructions at home: Activity Exercise. Adults should: Exercise for at least 150 minutes each week. The exercise should increase your heart rate and make you sweat (moderate-intensity  exercise). Do strengthening exercises at least twice a week. Get the right amount and quality of sleep. Most adults need 7-9 hours of sleep each night. Lifestyle  Eat a healthy diet that includes plenty of vegetables, fruits, whole grains, low-fat dairy products, and lean protein. Do not eat a lot of foods that are high in fats, added sugars, or salt (sodium). Make choices that simplify your life. Do not use any products that contain nicotine or tobacco. These products include cigarettes, chewing tobacco, and vaping devices, such as e-cigarettes. If you need help quitting, ask your provider. Avoid caffeine, alcohol , and certain over-the-counter cold medicines. These may make you feel worse. Ask your pharmacist which medicines to avoid. General instructions Take over-the-counter and prescription medicines only as told by your provider. Keep all follow-up visits. This is to make sure you are managing your anxiety well or if you need more support. Where to find support You can get help and support from: Self-help groups. Online and [employer]. A trusted spiritual leader. Couples counseling. Family education classes. Family therapy. Where to find more information You may find that joining a support group helps you deal with your anxiety. The following sources can help you find counselors or support groups near you: Mental Health America: mentalhealthamerica.net Anxiety and Depression Association of America (ADAA): adaa.org [employer] on Mental Illness (NAMI): nami.org Contact a health  care provider if: You have a hard time staying focused or finishing tasks. You spend many hours a day feeling worried about everyday life. You are very tired because you cannot stop worrying. You start to have headaches or often feel tense. You have chronic nausea or diarrhea. Get help right away if: Your heart feels like it is racing. You have shortness of breath. You have thoughts of  hurting yourself or others. Get help right away if you feel like you may hurt yourself or others, or have thoughts about taking your own life. Go to your nearest emergency room or: Call 911. Call the National Suicide Prevention Lifeline at (763)391-0016 or 988. This is open 24 hours a day. Text the Crisis Text Line at (319) 371-8853. This information is not intended to replace advice given to you by your health care provider. Make sure you discuss any questions you have with your health care provider. Document Revised: 03/03/2022 Document Reviewed: 09/15/2020 Elsevier Patient Education  2024 Elsevier Inc.   Managing Depression, Adult Depression is a mental health condition that affects your thoughts, feelings, and actions. Being diagnosed with depression can bring you relief if you did not know why you have felt or behaved a certain way. It could also leave you feeling overwhelmed. Finding ways to manage your symptoms can help you feel more positive about your future. How to manage lifestyle changes Being depressed is difficult. Depression can increase the level of everyday stress. Stress can make depression symptoms worse. You may believe your symptoms cannot be managed or will never improve. However, there are many things you can try to help manage your symptoms. There is hope. Managing stress  Stress is your body's reaction to life changes and events, both good and bad. Stress can add to your feelings of depression. Learning to manage your stress can help lessen your feelings of depression. Try some of the following approaches to reducing your stress (stress reduction techniques): Listen to music that you enjoy and that inspires you. Try using a meditation app or take a meditation class. Develop a practice that helps you connect with your spiritual self. Walk in nature, pray, or go to a place of worship. Practice deep breathing. To do this, inhale slowly through your nose. Pause at the top of your  inhale for a few seconds and then exhale slowly, letting yourself relax. Repeat this three or four times. Practice yoga to help relax and work your muscles. Choose a stress reduction technique that works for you. These techniques take time and practice to develop. Set aside 5-15 minutes a day to do them. Therapists can offer training in these techniques. Do these things to help manage stress: Keep a journal. Know your limits. Set healthy boundaries for yourself and others, such as saying no when you think something is too much. Pay attention to how you react to certain situations. You may not be able to control everything, but you can change your reaction. Add humor to your life by watching funny movies or shows. Make time for activities that you enjoy and that relax you. Spend less time using electronics, especially at night before bed. The light from screens can make your brain think it is time to get up rather than go to bed.  Medicines Medicines, such as antidepressants, are often a part of treatment for depression. Talk with your pharmacist or health care provider about all the medicines, supplements, and herbal products that you take, their possible side effects, and what medicines  and other products are safe to take together. Make sure to report any side effects you may have to your health care provider. Relationships Your health care provider may suggest family therapy, couples therapy, or individual therapy as part of your treatment. How to recognize changes Everyone responds differently to treatment for depression. As you recover from depression, you may start to: Have more interest in doing activities. Feel more hopeful. Have more energy. Eat a more regular amount of food. Have better mental focus. It is important to recognize if your depression is not getting better or is getting worse. The symptoms you had in the beginning may return, such as: Feeling tired. Eating too much or  too little. Sleeping too much or too little. Feeling restless, agitated, or hopeless. Trouble focusing or making decisions. Having unexplained aches and pains. Feeling irritable, angry, or aggressive. If you or your family members notice these symptoms coming back, let your health care provider know right away. Follow these instructions at home: Activity Try to get some form of exercise each day, such as walking. Try yoga, mindfulness, or other stress reduction techniques. Participate in group activities if you are able. Lifestyle Get enough sleep. Cut down on or stop using caffeine, tobacco, alcohol , and any other harmful substances. Eat a healthy diet that includes plenty of vegetables, fruits, whole grains, low-fat dairy products, and lean protein. Limit foods that are high in solid fats, added sugar, or salt (sodium). General instructions Take over-the-counter and prescription medicines only as told by your health care provider. Keep all follow-up visits. It is important for your health care provider to check on your mood, behavior, and medicines. Your health care provider may need to make changes to your treatment. Where to find support Talking to others  Friends and family members can be sources of support and guidance. Talk to trusted friends or family members about your condition. Explain your symptoms and let them know that you are working with a health care provider to treat your depression. Tell friends and family how they can help. Finances Find mental health providers that fit with your financial situation. Talk with your health care provider if you are worried about access to food, housing, or medicine. Call your insurance company to learn about your co-pays and prescription plan. Where to find more information You can find support in your area from: Anxiety and Depression Association of America (ADAA): adaa.org Mental Health America: mentalhealthamerica.net [employer] on Mental Illness: nami.org Contact a health care provider if: You stop taking your antidepressant medicines, and you have any of these symptoms: Nausea. Headache. Light-headedness. Chills and body aches. Not being able to sleep (insomnia). You or your friends and family think your depression is getting worse. Get help right away if: You have thoughts of hurting yourself or others. Get help right away if you feel like you may hurt yourself or others, or have thoughts about taking your own life. Go to your nearest emergency room or: Call 911. Call the National Suicide Prevention Lifeline at (629) 596-9448 or 988. This is open 24 hours a day. Text the Crisis Text Line at 405-213-9780. This information is not intended to replace advice given to you by your health care provider. Make sure you discuss any questions you have with your health care provider. Document Revised: 09/30/2021 Document Reviewed: 09/30/2021 Elsevier Patient Education  2024 Elsevier Inc.   DASH Eating Plan DASH stands for Dietary Approaches to Stop Hypertension. The DASH eating plan is a healthy eating  plan that has been shown to: Lower high blood pressure (hypertension). Reduce your risk for type 2 diabetes, heart disease, and stroke. Help with weight loss. What are tips for following this plan? Reading food labels Check food labels for the amount of salt (sodium) per serving. Choose foods with less than 5 percent of the Daily Value (DV) of sodium. In general, foods with less than 300 milligrams (mg) of sodium per serving fit into this eating plan. To find whole grains, look for the word whole as the first word in the ingredient list. Shopping Buy products labeled as low-sodium or no salt added. Buy fresh foods. Avoid canned foods and pre-made or frozen meals. Cooking Try not to add salt when you cook. Use salt-free seasonings or herbs instead of table salt or sea salt. Check with your health care provider  or pharmacist before using salt substitutes. Do not fry foods. Cook foods in healthy ways, such as baking, boiling, grilling, roasting, or broiling. Cook using oils that are good for your heart. These include olive, canola, avocado, soybean, and sunflower oil. Meal planning  Eat a balanced diet. This should include: 4 or more servings of fruits and 4 or more servings of vegetables each day. Try to fill half of your plate with fruits and vegetables. 6-8 servings of whole grains each day. 6 or less servings of lean meat, poultry, or fish each day. 1 oz is 1 serving. A 3 oz (85 g) serving of meat is about the same size as the palm of your hand. One egg is 1 oz (28 g). 2-3 servings of low-fat dairy each day. One serving is 1 cup (237 mL). 1 serving of nuts, seeds, or beans 5 times each week. 2-3 servings of heart-healthy fats. Healthy fats called omega-3 fatty acids are found in foods such as walnuts, flaxseeds, fortified milks, and eggs. These fats are also found in cold-water fish, such as sardines, salmon, and mackerel. Limit how much you eat of: Canned or prepackaged foods. Food that is high in trans fat, such as fried foods. Food that is high in saturated fat, such as fatty meat. Desserts and other sweets, sugary drinks, and other foods with added sugar. Full-fat dairy products. Do not salt foods before eating. Do not eat more than 4 egg yolks a week. Try to eat at least 2 vegetarian meals a week. Eat more home-cooked food and less restaurant, buffet, and fast food. Lifestyle When eating at a restaurant, ask if your food can be made with less salt or no salt. If you drink alcohol : Limit how much you have to: 0-1 drink a day if you are female. 0-2 drinks a day if you are female. Know how much alcohol  is in your drink. In the [place] one drink is one 12 oz bottle of beer (355 mL), one 5 oz glass of wine (148 mL), or one 1 oz glass of hard liquor (44 mL). General information Avoid eating  more than 2,300 mg of salt a day. If you have hypertension, you may need to reduce your sodium intake to 1,500 mg a day. Work with your provider to stay at a healthy body weight or lose weight. Ask what the best weight range is for you. On most days of the week, get at least 30 minutes of exercise that causes your heart to beat faster. This may include walking, swimming, or biking. Work with your provider or dietitian to adjust your eating plan to meet your specific calorie needs.  What foods should I eat? Fruits All fresh, dried, or frozen fruit. Canned fruits that are in their natural juice and do not have sugar added to them. Vegetables Fresh or frozen vegetables that are raw, steamed, roasted, or grilled. Low-sodium or reduced-sodium tomato and vegetable juice. Low-sodium or reduced-sodium tomato sauce and tomato paste. Low-sodium or reduced-sodium canned vegetables. Grains Whole-grain or whole-wheat bread. Whole-grain or whole-wheat pasta. Brown rice. [place]neil Madeira. Bulgur. Whole-grain and low-sodium cereals. Pita bread. Low-fat, low-sodium crackers. Whole-wheat flour tortillas. Meats and other proteins Skinless chicken or turkey. Ground chicken or turkey. Pork with fat trimmed off. Fish and seafood. Egg whites. Dried beans, peas, or lentils. Unsalted nuts, nut butters, and seeds. Unsalted canned beans. Lean cuts of beef with fat trimmed off. Low-sodium, lean precooked or cured meat, such as sausages or meat loaves. Dairy Low-fat (1%) or fat-free (skim) milk. Reduced-fat, low-fat, or fat-free cheeses. Nonfat, low-sodium ricotta or cottage cheese. Low-fat or nonfat yogurt. Low-fat, low-sodium cheese. Fats and oils Soft margarine without trans fats. Vegetable oil. Reduced-fat, low-fat, or light mayonnaise and salad dressings (reduced-sodium). Canola, safflower, olive, avocado, soybean, and sunflower oils. Avocado. Seasonings and condiments Herbs. Spices. Seasoning mixes without salt. Other  foods Unsalted popcorn and pretzels. Fat-free sweets. The items listed above may not be all the foods and drinks you can have. Talk to a dietitian to learn more. What foods should I avoid? Fruits Canned fruit in a light or heavy syrup. Fried fruit. Fruit in cream or butter sauce. Vegetables Creamed or fried vegetables. Vegetables in a cheese sauce. Regular canned vegetables that are not marked as low-sodium or reduced-sodium. Regular canned tomato sauce and paste that are not marked as low-sodium or reduced-sodium. Regular tomato and vegetable juices that are not marked as low-sodium or reduced-sodium. Dene. Olives. Grains Baked goods made with fat, such as croissants, muffins, or some breads. Dry pasta or rice meal packs. Meats and other proteins Fatty cuts of meat. Ribs. Fried meat. [place]ona. Bologna, salami, and other precooked or cured meats, such as sausages or meat loaves, that are not lean and low in sodium. Fat from the back of a pig (fatback). Bratwurst. Salted nuts and seeds. Canned beans with added salt. Canned or smoked fish. Whole eggs or egg yolks. Chicken or turkey with skin. Dairy Whole or 2% milk, cream, and half-and-half. Whole or full-fat cream cheese. Whole-fat or sweetened yogurt. Full-fat cheese. Nondairy creamers. Whipped toppings. Processed cheese and cheese spreads. Fats and oils Butter. Stick margarine. Lard. Shortening. Ghee. Bacon fat. Tropical oils, such as coconut, palm kernel, or palm oil. Seasonings and condiments Onion salt, garlic salt, seasoned salt, table salt, and sea salt. Worcestershire sauce. Tartar sauce. Barbecue sauce. Teriyaki sauce. Soy sauce, including reduced-sodium soy sauce. Steak sauce. Canned and packaged gravies. Fish sauce. Oyster sauce. Cocktail sauce. Store-bought horseradish. Ketchup. Mustard. Meat flavorings and tenderizers. Bouillon cubes. Hot sauces. Pre-made or packaged marinades. Pre-made or packaged taco seasonings. Relishes. Regular  salad dressings. Other foods Salted popcorn and pretzels. The items listed above may not be all the foods and drinks you should avoid. Talk to a dietitian to learn more. Where to find more information National Heart, Lung, and Blood Institute (NHLBI): buffalodrycleaner.gl American Heart Association (AHA): heart.org Academy of Nutrition and Dietetics: eatright.org National Kidney Foundation (NKF): kidney.org This information is not intended to replace advice given to you by your health care provider. Make sure you discuss any questions you have with your health care provider. Document Revised: 06/11/2022 Document Reviewed: 06/11/2022 Elsevier  Patient Education  2024 [employer].

## 2024-06-05 ENCOUNTER — Telehealth: Payer: Self-pay | Admitting: Licensed Clinical Social Worker

## 2024-06-05 ENCOUNTER — Encounter: Payer: Self-pay | Admitting: Licensed Clinical Social Worker

## 2024-06-05 NOTE — Patient Instructions (Signed)
 Devere ONEIDA Hope - I am sorry I was unable to reach you today for our scheduled appointment. I work with Gareth, Mliss FALCON, FNP and am calling to support your healthcare needs. Please contact me at [phone] at your earliest convenience. I look forward to speaking with you soon.   Thank you,  Cena Ligas, LCSW Clinical Social Worker [facility] Population Health

## 2024-06-06 ENCOUNTER — Telehealth: Payer: Self-pay

## 2024-06-06 NOTE — Patient Instructions (Signed)
 Devere ONEIDA Hope - I am sorry I was unable to reach you today for our scheduled appointment. I work with Gareth, Mliss FALCON, FNP and am calling to support your healthcare needs. Please contact me at [phone] at your earliest convenience. I look forward to speaking with you soon.   Thank you,  Thersia Hoar, BSW, MHA [place]  [facility] Social Worker, Population Health [phone]

## 2024-06-12 ENCOUNTER — Telehealth: Payer: Self-pay

## 2024-06-12 NOTE — Progress Notes (Signed)
 Complex Care Management Note Care Guide Note  06/12/2024 Name: Miranda Petersen MRN: [id number] DOB: 1960-06-13   Complex Care Management Outreach Attempts: An unsuccessful telephone outreach was attempted today to offer the patient information about available complex care management services.  Follow Up Plan:  Additional outreach attempts will be made to offer the patient complex care management information and services.   Encounter Outcome:  No Answer  Dreama Lynwood [facility]  [facility], [facility] Assistant Direct Dial: [phone]  Fax: [phone]

## 2024-06-13 NOTE — Progress Notes (Signed)
 Complex Care Management Care Guide Note  06/13/2024 Name: Miranda Petersen MRN: [id number] DOB: Jun 20, 1960  Miranda Petersen is a 64 y.o. year old female who is a primary care patient of Gareth Mliss FALCON, FNP and is actively engaged with the care management team. I reached out to Miranda Petersen by phone today to assist with re-scheduling  with the LCSW & BSW.  Follow up plan: Telephone appointment with complex care management team member scheduled for:  06/15/24 & 06/20/24.  Miranda Petersen [facility]  [facility], [facility] Assistant Direct Dial: [phone]  Fax: [phone]

## 2024-06-15 ENCOUNTER — Other Ambulatory Visit: Payer: Self-pay

## 2024-06-15 NOTE — Patient Instructions (Signed)
 Visit Information  Miranda Petersen was given information about Medicaid Managed Care team care coordination services as a part of their [place] Medicaid benefit.   If you would like to schedule transportation through your [place] plan, please call the following number at least 2 days in advance of your appointment: [phone].   There is no limit to the number of trips during the year between medical appointments, healthcare facilities, or pharmacies. Transportation must be scheduled at least 2 business days before but not more than thirty 30 days before of your appointment.  Call the Behavioral Health Crisis Line at [phone], at any time, 24 hours a day, 7 days a week. If you are in danger or need immediate medical attention call 911.     The Patient has been provided with contact information for the Managed Medicaid care management team and has been advised to call with any health related questions or concerns.   Thersia Hoar, HEDWIG, MHA [place]  [facility] Social Worker, Population Health [phone]   Following is a copy of your plan of care:   Goals Addressed   None

## 2024-06-15 NOTE — Patient Outreach (Signed)
 SW completed a telephone outreach with patient, patient only wanted to know if her disbility would effect her Medicaid sand it will not. Patient states she has no SDOH needs at this time.    Miranda Petersen, HEDWIG, MHA [place]  [facility] Social Worker, Population Health [phone]

## 2024-06-16 ENCOUNTER — Other Ambulatory Visit: Payer: Self-pay

## 2024-06-16 NOTE — Patient Instructions (Signed)
 Visit Information  Ms. Buchta was given information about Medicaid Managed Care team care coordination services as a part of their [place] benefit.   If you would like to schedule transportation through your [place] plan, please call the following number at least 2 days in advance of your appointment: [phone].   There is no limit to the number of trips during the year between medical appointments, healthcare facilities, or pharmacies. Transportation must be scheduled at least 2 business days before but not more than thirty 30 days before of your appointment.  Call the Behavioral Health Crisis Line at [phone], at any time, 24 hours a day, 7 days a week. If you are in danger or need immediate medical attention call 911.   Please see education materials related to NONE provided as print materials.  The patient verbalized understanding of instructions, educational materials, and care plan provided today and agreed to receive a mailed copy of patient instructions, educational materials, and care plan.   Telephone follow up appointment with Managed Medicaid care management team member scheduled for:  Miranda Duos, MSN, RN [facility]  [facility], [facility] RN Care Manager Direct Dial: [phone] Fax: [phone]'  Following is a copy of your plan of care:   Goals Addressed             This Visit's Progress    [facility] RN Care Plan- OA Fibromyalgia Falls   On track    Problems:  Chronic Disease Management support and education needs related to Osteoarthritis and Fibromyalgia, Falls  Goal: Over the next 90 days the Patient will attend all scheduled medical appointments: Patient will attend appointments as evidenced by chart review        continue to work with RN Care Manager and/or Social Worker to address care management and care coordination needs related to Osteoarthritis and Fibromyalgia and Falls as evidenced  by adherence to care management team scheduled appointments     take all medications exactly as prescribed and will call provider for medication related questions as evidenced by patient report    verbalize basic understanding of Osteoarthritis and Fibromyalgia and falls disease process and self health management plan as evidenced by taking medications as prescribed, promptly reporting changes in symptoms, falls, concerns to provider, lifestyle modifications including increasing water intake, reducing caffeine and alcohol , heart healthy low sodium diet, exercise as tolerated, consider use of cane as needed with OA/Fibromyalgia pain flare work with child psychotherapist to address Limited education about Medicare/Disability - BSW* and , LCSW  related to the management of Anxiety, Depression, and related to medical condition as evidenced by review of electronic medical record and patient or social worker report     work with PCP to discuss memory and anxiety/depression as evidenced by review of electronic medical record and patient or care team member report    Interventions:   Falls Interventions: Provided written and verbal education re: potential causes of falls and Fall prevention strategies Reviewed medications and discussed potential side effects of medications such as dizziness and frequent urination Advised patient of importance of notifying provider of falls Assessed for signs and symptoms of orthostatic hypotension - none Assessed for falls since last encounter - no new falls Assessed patients knowledge of fall risk prevention secondary to previously provided education Screening for signs and symptoms of depression related to chronic disease state - PHQ-9 negative today, scheduled with LCSW 06/20/24 Assessed social determinant of health barriers   Pain Interventions: Pain assessment  performed - pain stable 2/10 tramadol  helps Medications reviewed Reviewed provider established plan for pain  management Discussed importance of adherence to all scheduled medical appointments Counseled on the importance of reporting any/all new or changed pain symptoms or management strategies to pain management provider Advised patient to report to care team affect of pain on daily activities Discussed use of relaxation techniques and/or diversional activities to assist with pain reduction (distraction, imagery, relaxation, massage, acupressure, TENS, heat, and cold application Reviewed with patient prescribed pharmacological and nonpharmacological pain relief strategies  Patient Self-Care Activities:  Attend all scheduled provider appointments Call pharmacy for medication refills 3-7 days in advance of running out of medications Call provider office for new concerns or questions  Take medications as prescribed   Work with the social worker to address care coordination needs and will continue to work with the clinical team to address health care and disease management related needs  Plan:  Telephone follow up appointment with care management team member scheduled for:  07/14/2024 at 1:30 pm

## 2024-06-16 NOTE — Patient Outreach (Signed)
 Complex Care Management   Visit Note  06/16/2024  Name:  Miranda Petersen MRN: [id number] DOB: 03-Jul-1960  Situation: Referral received for Complex Care Management related to Fibromyalgia, Falls I obtained verbal consent from Patient.  Visit completed with Patient  on the phone  Background:   Past Medical History:  Diagnosis Date   Allergy    Arthritis    Contracture of hand    Depression    Fibromyalgia    GERD (gastroesophageal reflux disease)    Insomnia    Nonspecific abnormal results of cardiovascular function study 07/09/2010   Subsequent cardiac catheterization demonstrating normal coronary arteries with exception of myocardial LAD bridging   Occasional tremors    Syndactyly    Status post surgical repair    Assessment: Patient Reported Symptoms:  Cognitive        Neurological Neurological Review of Symptoms: Weakness (no change)    HEENT HEENT Symptoms Reported: No symptoms reported      Cardiovascular Cardiovascular Symptoms Reported: No symptoms reported Cardiovascular Management Strategies: Routine screening  Respiratory Respiratory Symptoms Reported: Shortness of breath Additional Respiratory Details: unchanged Respiratory Management Strategies: Routine screening  Endocrine Endocrine Symptoms Reported: Not assessed Is patient diabetic?: No    Gastrointestinal Gastrointestinal Symptoms Reported: Reflux/heartburn Gastrointestinal Comment: reports drinking more water still with caffeine and alcohol     Genitourinary Genitourinary Symptoms Reported: No symptoms reported    Integumentary Integumentary Symptoms Reported: No symptoms reported    Musculoskeletal Musculoskelatal Symptoms Reviewed: Back pain, Joint pain, Muscle pain, Weakness Additional Musculoskeletal Details: pain rating currently a 2/10 generalized discomfort, back and neck 2/10 tramadol  helps with pain overall Musculoskeletal Management Strategies: Medication therapy Musculoskeletal Comment: no  new falls Falls in the past year?: Yes Number of falls in past year: 2 or more Was there an injury with Fall?: Yes Fall Risk Category Calculator: 3 Patient Fall Risk Level: High Fall Risk    Psychosocial Psychosocial Symptoms Reported: Anxiety - if selected complete GAD, Depression - if selected complete PHQ 2-9, Substance use (alcohol  2 glasses wine daily) Additional Psychological Details: patient sounding different this call, asked directly about change in mood from previous interaction, states recognized earlier just did not feel like talking today, RNCM thanked patient for completing appointment and she agreed to reschedule, denied specific concerns issues, PHQ-9 negative Behavioral Health Comment: appointment with LCSW 06/20/24, reports playing on phone helps - has habit of scrathing scalp, picking at skin if not occupied Major Change/Loss/Stressor/Fears (CP): Medical condition, self, Medical condition, family Techniques to [place] with Loss/Stress/Change: Medication      06/16/2024    PHQ2-9 Depression Screening   Little interest or pleasure in doing things Several days  Feeling down, depressed, or hopeless Several days  PHQ-2 - Total Score 2  Trouble falling or staying asleep, or sleeping too much Nearly every day  Feeling tired or having little energy Nearly every day  Poor appetite or overeating  Not at all  Feeling bad about yourself - or that you are a failure or have let yourself or your family down Nearly every day (guilt)  Trouble concentrating on things, such as reading the newspaper or watching television Not at all  Moving or speaking so slowly that other people could have noticed.  Or the opposite - being so fidgety or restless that you have been moving around a lot more than usual Not at all  Thoughts that you would be better off dead, or hurting yourself in some way Not at all (states will  call 911 for any thoughts of hurting self)  PHQ2-9 Total Score 11  If you checked off  any problems, how difficult have these problems made it for you to do your work, take care of things at home, or get along with other people    Depression Interventions/Treatment Medication    There were no vitals filed for this visit.    Medications Reviewed Today     Reviewed by Devra Lands, RN (Registered Nurse) on 06/16/24 at 1403  Med List Status: <None>   Medication Order Taking? Sig Documenting Provider Last Dose Status Informant  amoxicillin -clavulanate (AUGMENTIN ) 875-125 MG tablet [id number]  Take 1 tablet by mouth every 12 (twelve) hours.  Patient not taking: Reported on 05/29/2024   Teresa Shelba SAUNDERS, NP  Active   cyanocobalamin  (VITAMIN B12) 1000 MCG/ML injection [id number] Yes Inject 1 mL (1,000 mcg total) into the muscle every 30 (thirty) days. Pender, Julie F, FNP  Active   cyclobenzaprine  (FLEXERIL ) 10 MG tablet [id number] Yes Take 10 mg by mouth at bedtime. [provider]  Active   cyclobenzaprine  (FLEXERIL ) 10 MG tablet [id number] Yes Take 1 tablet (10 mg total) by mouth 3 (three) times daily as needed for muscle spasms. Gareth Mliss FALCON, FNP  Active   PARoxetine  (PAXIL ) 20 MG tablet [id number] Yes Take 1 tablet (20 mg total) by mouth at bedtime. Athar, Saima, MD  Active   propranolol  (INDERAL ) 10 MG tablet [id number] Yes Take 1 tablet (10 mg total) by mouth 3 (three) times daily as needed. Gareth Mliss FALCON, FNP  Active   traMADol  (ULTRAM ) 50 MG tablet [id number] Yes Take 50 mg by mouth every 8 (eight) hours as needed. Maximum dose= 8 tablets per day [provider]  Active   traMADol  (ULTRAM ) 50 MG tablet [id number]  Take 1 tablet (50 mg total) by mouth every 8 (eight) hours as needed. Gareth Mliss FALCON, FNP  Active   zolpidem  (AMBIEN ) 10 MG tablet [id number] Yes Take 1 tablet (10 mg total) by mouth at bedtime as needed for sleep. Gareth Mliss FALCON, FNP  Active             Recommendation:   PCP Follow-up Continue Current Plan of Care LCSW 06/20/24  Follow  Up Plan:   Telephone follow-up in 1 month  Lands Devra, MSN, RN [facility]  [facility], [facility] RN Care Manager Direct Dial: [phone] Fax: [phone]

## 2024-06-20 ENCOUNTER — Other Ambulatory Visit: Payer: Self-pay | Admitting: Licensed Clinical Social Worker

## 2024-06-20 NOTE — Patient Outreach (Signed)
 LCSW called and spoke to patient on the phone. LCSW introduced self and explained reason for the call. Patient stated that she was interested in a therapist due to recent depression and anxiety. Patient requested a virtual therapist. Patient gave LCSW permission to do a referral to [facility]. LCSW completed referral.   Cena Ligas, LCSW Clinical Social Worker [facility]

## 2024-06-20 NOTE — Patient Instructions (Signed)
 Visit Information  Ms. Bonczek was given information about Medicaid Managed Care team care coordination services as a part of their [place] [employer] Medicaid benefit.   If you would like to schedule transportation through your [place] plan, please call the following number at least 2 days in advance of your appointment: [phone].   There is no limit to the number of trips during the year between medical appointments, healthcare facilities, or pharmacies. Transportation must be scheduled at least 2 business days before but not more than thirty 30 days before of your appointment.  Call the Behavioral Health Crisis Line at [phone], at any time, 24 hours a day, 7 days a week. If you are in danger or need immediate medical attention call 911.   Patient verbalizes understanding of instructions and care plan provided today and agrees to view in MyChart. Active MyChart status and patient understanding of how to access instructions and care plan via MyChart confirmed with patient.     Licensed Clinical Social Worker will follow up with patient by phone on 07/04/24.   Cena Ligas, LCSW Clinical Social Worker [facility]   Following is a copy of your plan of care:   Goals Addressed   None

## 2024-06-30 ENCOUNTER — Telehealth: Admitting: Physician Assistant

## 2024-06-30 ENCOUNTER — Encounter: Payer: Self-pay | Admitting: Nurse Practitioner

## 2024-06-30 DIAGNOSIS — K122 Cellulitis and abscess of mouth: Secondary | ICD-10-CM

## 2024-06-30 MED ORDER — CLINDAMYCIN HCL 300 MG PO CAPS: 300.0000 mg | ORAL_CAPSULE | Freq: Three times a day (TID) | ORAL | 0 refills | Status: AC

## 2024-06-30 MED ORDER — IBUPROFEN 600 MG PO TABS: 600.0000 mg | ORAL_TABLET | Freq: Three times a day (TID) | ORAL | 0 refills | Status: AC | PRN

## 2024-06-30 NOTE — Progress Notes (Signed)
 E-Visit for Dental Pain  We are sorry that you are not feeling well.  Here is how we plan to help!  Based on what you have shared with me in the questionnaire, it sounds like you have a recurrent abscess of the right side.  I have prescribed:  Clindamycin  300mg  3 times a day for 7 days and Ibuprofen  600mg  3 times a day for 7 days for discomfort  It is imperative that you see a dentist within 10 days of this eVisit to determine the cause of the dental pain and be sure it is adequately treated  A toothache or tooth pain is caused when the nerve in the root of a tooth or surrounding a tooth is irritated. Dental (tooth) infection, decay, injury, or loss of a tooth are the most common causes of dental pain. Pain may also occur after an extraction (tooth is pulled out). Pain sometimes originates from other areas and radiates to the jaw, thus appearing to be tooth pain.Bacteria growing inside your mouth can contribute to gum disease and dental decay, both of which can cause pain. A toothache occurs from inflammation of the central portion of the tooth called pulp. The pulp contains nerve endings that are very sensitive to pain. Inflammation to the pulp or pulpitis may be caused by dental cavities, trauma, and infection.    HOME CARE:   For toothaches: Over-the-counter pain medications such as acetaminophen or ibuprofen  may be used. Take these as directed on the package while you arrange for a dental appointment. Avoid very cold or hot foods, because they may make the pain worse. You may get relief from biting on a cotton ball soaked in oil of cloves. You can get oil of cloves at most drug stores.  For jaw pain:  Aspirin may be helpful for problems in the joint of the jaw in adults. If pain happens every time you open your mouth widely, the temporomandibular joint (TMJ) may be the source of the pain. Yawning or taking a large bite of food may worsen the pain. An appointment with your doctor or  dentist will help you find the cause.     GET HELP RIGHT AWAY IF:  You have a high fever or chills If you have had a recent head or face injury and develop headache, light headedness, nausea, vomiting, or other symptoms that concern you after an injury to your face or mouth, you could have a more serious injury in addition to your dental injury. A facial rash associated with a toothache: This condition may improve with medication. Contact your doctor for them to decide what is appropriate. Any jaw pain occurring with chest pain: Although jaw pain is most commonly caused by dental disease, it is sometimes referred pain from other areas. People with heart disease, especially people who have had stents placed, people with diabetes, or those who have had heart surgery may have jaw pain as a symptom of heart attack or angina. If your jaw or tooth pain is associated with lightheadedness, sweating, or shortness of breath, you should see a doctor as soon as possible. Trouble swallowing or excessive pain or bleeding from gums: If you have a history of a weakened immune system, diabetes, or steroid use, you may be more susceptible to infections. Infections can often be more severe and extensive or caused by unusual organisms. Dental and gum infections in people with these conditions may require more aggressive treatment. An abscess may need draining or IV antibiotics, for example.  MAKE SURE YOU   Understand these instructions. Will watch your condition. Will get help right away if you are not doing well or get worse.  Thank you for choosing an e-visit.  Your e-visit answers were reviewed by a board certified advanced clinical practitioner to complete your personal care plan. Depending upon the condition, your plan could have included both over the counter or prescription medications.  Please review your pharmacy choice. Make sure the pharmacy is open so you can pick up prescription now. If there is a  problem, you may contact your provider through [employer] and have the prescription routed to another pharmacy.  Your safety is important to us . If you have drug allergies check your prescription carefully.   For the next 24 hours you can use MyChart to ask questions about today's visit, request a non-urgent call back, or ask for a work or school excuse. You will get an email in the next two days asking about your experience. I hope that your e-visit has been valuable and will speed your recovery.  I have spent 5 minutes in review of e-visit questionnaire, review and updating patient chart, medical decision making and response to patient.   Delon CHRISTELLA Dickinson, PA-C

## 2024-07-04 ENCOUNTER — Telehealth: Payer: Self-pay | Admitting: Licensed Clinical Social Worker

## 2024-07-04 ENCOUNTER — Encounter: Payer: Self-pay | Admitting: Licensed Clinical Social Worker

## 2024-07-04 NOTE — Patient Instructions (Signed)
 Miranda Petersen - I am sorry I was unable to reach you today for our scheduled appointment. I work with Gareth, Mliss FALCON, FNP and am calling to support your healthcare needs. Please contact me at [phone] at your earliest convenience. I look forward to speaking with you soon.   Thank you,  Cena Ligas, LCSW Clinical Social Worker [facility]

## 2024-07-14 ENCOUNTER — Other Ambulatory Visit: Payer: Self-pay

## 2024-07-14 NOTE — Patient Instructions (Signed)
 Miranda Petersen - I am sorry I was unable to reach you today for our scheduled appointment. I work with Gareth, Mliss FALCON, FNP and Nestora RN and am calling to support your healthcare needs. Please contact me at [phone] at your earliest convenience. I look forward to speaking with you soon.   Thank you,  Khristi Schiller  Administrator, [employer] [phone]

## 2024-07-28 ENCOUNTER — Telehealth: Payer: Self-pay

## 2024-08-07 ENCOUNTER — Ambulatory Visit: Admitting: Nurse Practitioner
